# Patient Record
Sex: Female | Born: 1945 | Race: White | Hispanic: No | Marital: Married | Smoking: Never smoker
Health system: Southern US, Community
[De-identification: ages and names within clinical notes are randomized; demographics above are authoritative.]

## PROBLEM LIST (undated history)

## (undated) DIAGNOSIS — E7800 Pure hypercholesterolemia, unspecified: Secondary | ICD-10-CM

## (undated) DIAGNOSIS — R079 Chest pain, unspecified: Secondary | ICD-10-CM

## (undated) DIAGNOSIS — R0602 Shortness of breath: Secondary | ICD-10-CM

## (undated) DIAGNOSIS — R011 Cardiac murmur, unspecified: Secondary | ICD-10-CM

## (undated) DIAGNOSIS — R059 Cough, unspecified: Secondary | ICD-10-CM

## (undated) DIAGNOSIS — G4700 Insomnia, unspecified: Secondary | ICD-10-CM

## (undated) DIAGNOSIS — T7840XA Allergy, unspecified, initial encounter: Secondary | ICD-10-CM

## (undated) DIAGNOSIS — J309 Allergic rhinitis, unspecified: Secondary | ICD-10-CM

## (undated) DIAGNOSIS — R05 Cough: Secondary | ICD-10-CM

## (undated) HISTORY — DX: Shortness of breath: R06.02

## (undated) HISTORY — DX: Allergy, unspecified, initial encounter: T78.40XA

## (undated) HISTORY — DX: Cough, unspecified: R05.9

## (undated) HISTORY — DX: Pure hypercholesterolemia, unspecified: E78.00

## (undated) HISTORY — DX: Chest pain, unspecified: R07.9

## (undated) HISTORY — DX: Insomnia, unspecified: G47.00

## (undated) HISTORY — DX: Allergic rhinitis, unspecified: J30.9

## (undated) HISTORY — DX: Cough: R05

## (undated) HISTORY — DX: Cardiac murmur, unspecified: R01.1

---
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
## 1983-11-16 HISTORY — PX: BREAST SURGERY: SHX581

## 1988-11-15 HISTORY — PX: ABDOMINAL HYSTERECTOMY: SHX81

## 1988-11-15 HISTORY — PX: VESICOVAGINAL FISTULA CLOSURE W/ TAH: SUR271

## 2011-11-02 ENCOUNTER — Ambulatory Visit: Payer: Self-pay | Admitting: Family Medicine

## 2011-12-23 LAB — HM PAP SMEAR

## 2012-02-10 ENCOUNTER — Ambulatory Visit: Payer: Self-pay | Admitting: Family Medicine

## 2012-09-30 ENCOUNTER — Emergency Department: Payer: Self-pay | Admitting: Emergency Medicine

## 2012-09-30 LAB — COMPREHENSIVE METABOLIC PANEL
Albumin: 4.5 g/dL (ref 3.4–5.0)
Anion Gap: 7 (ref 7–16)
Calcium, Total: 9.6 mg/dL (ref 8.5–10.1)
Chloride: 107 mmol/L (ref 98–107)
Co2: 26 mmol/L (ref 21–32)
Creatinine: 0.76 mg/dL (ref 0.60–1.30)
EGFR (African American): >60
EGFR (Non-African Amer.): >60
Osmolality: 283 (ref 275–301)
SGOT(AST): 27 U/L (ref 15–37)
Total Protein: 8.4 g/dL — ABNORMAL HIGH (ref 6.4–8.2)

## 2012-09-30 LAB — CBC
HCT: 41.7 % (ref 35.0–47.0)
HGB: 14.1 g/dL (ref 12.0–16.0)
MCHC: 33.7 g/dL (ref 32.0–36.0)
RBC: 4.59 10*6/uL (ref 3.80–5.20)
RDW: 13.7 % (ref 11.5–14.5)

## 2012-09-30 LAB — CK TOTAL AND CKMB (NOT AT ARMC)
CK, Total: 85 U/L (ref 21–215)
CK-MB: 0.7 ng/mL (ref 0.5–3.6)

## 2012-09-30 LAB — TROPONIN I: Troponin-I: <0.02 ng/mL

## 2012-11-03 ENCOUNTER — Ambulatory Visit: Payer: Self-pay | Admitting: Family Medicine

## 2012-11-30 ENCOUNTER — Ambulatory Visit: Payer: Self-pay | Admitting: Family Medicine

## 2012-12-08 ENCOUNTER — Ambulatory Visit: Payer: Self-pay | Admitting: Internal Medicine

## 2013-01-15 ENCOUNTER — Encounter: Payer: Self-pay | Admitting: Pulmonary Disease

## 2013-01-16 ENCOUNTER — Encounter: Payer: Self-pay | Admitting: Pulmonary Disease

## 2013-01-16 ENCOUNTER — Ambulatory Visit (INDEPENDENT_AMBULATORY_CARE_PROVIDER_SITE_OTHER): Payer: BC Managed Care – PPO | Admitting: Pulmonary Disease

## 2013-01-16 ENCOUNTER — Other Ambulatory Visit: Payer: Self-pay | Admitting: Pulmonary Disease

## 2013-01-16 VITALS — BP 140/100 | HR 80 | Temp 98.3°F | Ht 60.0 in | Wt 139.0 lb

## 2013-01-16 DIAGNOSIS — R0609 Other forms of dyspnea: Secondary | ICD-10-CM

## 2013-01-16 DIAGNOSIS — R0789 Other chest pain: Secondary | ICD-10-CM

## 2013-01-16 DIAGNOSIS — R0602 Shortness of breath: Secondary | ICD-10-CM

## 2013-01-16 MED ORDER — LEVALBUTEROL TARTRATE 45 MCG/ACT IN AERO: 2.0000 | INHALATION_SPRAY | RESPIRATORY_TRACT | Status: DC | PRN

## 2013-01-16 NOTE — Assessment & Plan Note (Addendum)
I am encouraged by the fact that so far Diamond Allen's workup has been normal. This has included a negative stress test and apparently a normal echocardiogram. The flow volume loop from her recent spirometry is not consistent with COPD or other forms of airflow obstruction. Objectively, radiology felt that she had some hyperinflation on a recent chest x-ray though I have reviewed the images and do not feel that they are very abnormal.  I explained to her that I think the most likely etiology of her dyspnea, cough and fatigue is that she had a nasty viral illness which is taking quite some time to recover. Her cough has improved which is encouraging to me and her lung exam is normal. An alternative possibility could be some degree of vocal cord dysfunction given her hoarseness and cough which comes on with prolonged talking. Because the symptoms have persisted for so long I agree with the ongoing cardiac workup (including repeat echocardiogram today) it would be helpful to see full pulmonary function testing to evaluate for restrictive lung disease. We also need to see hemoglobin level.  Her primary care physician has raised the possibility of adult onset asthma. It is not uncommon after a viral illness prefer to have some degree of bronchospasm so I hope that this is the case rather than asthma. The best way to rule this out as with medical challenge testing.  We will start with full pulmonary function testing with bronchodilators to see if there is any evidence of reversible airflow obstruction.   Plan summary: -Full pulmonary function testing with and without bronchodilator -6 minute walk today -Obtain records of recent CBC -Obtain results of echocardiogram to be performed today -Obtain records from cardiologist -I encouraged her to start exercising regularly -Change albuterol to Xopenex to use as needed for shortness of breath and cough -f/u 6-8 weeks

## 2013-01-16 NOTE — Progress Notes (Signed)
Subjective:    Patient ID: Diamond Allen, female    DOB: [birth year], 67 y.o.   MRN: [id number]  HPI This is a very pleasant 67 year old female who comes to our clinic to establish care for shortness of breath. When she was a child she had pneumonia on several occasions up until age 71. This required multiple hospitalizations and stay in a "convalescent home" for several months at age 67. After that she's had a cough nonproductive of sputum for the vast majority of her life but no shortness of breath or other respiratory complaints. 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 Around Thanksgiving 2013 she developed a cough productive of scant sputum which persisted through January. She sought care in late December and was diagnosed with bronchitis. She was treated with an antibiotic (she does not remember the name), albuterol which made her heart race, and prednisone which made her face break out. She says that the cough from November through January was more frequent and intense than her baseline chronic cough. It was worse with talking, exposure to smoke and or dust. She also experienced a sensation that she could not take a deep breath and shortness of breath with moderate exertion. She stated that she is capable of walking through the grocery store without difficulty, however carrying in groceries is difficult. Vacuuming her house and climbing a flight of stairs causes shortness of breath but she does not have to stop. She also experiences chest tightness during this time. When she use the albuterol she never really noticed an improvement in her symptoms, just the tachycardia. She stated that she would feel tachycardic when exerting herself and feeling shortness of breath but not while at rest.  She has experienced some sinus congestion which has improved with the use of Flonase and Singulair. She denies heartburn  symptoms.  She has had an echocardiogram and stress test which were both read as normal. Her echocardiogram is to be repeated today do to a heart murmur.  She feels like her voice is raspy since having bronchitis. She denies any new rash, symmetric joint aches or swelling, unexplained fevers or weight changes. She does not have trouble swallowing. She does state that she has dry eyes and dry mouth and has dealt with this for many years.   Past Medical History  Diagnosis Date  . Allergic rhinitis   . Chest pain on exertion   . Cough   . Hypercholesterolemia   . Heart murmur   . Insomnia   . SOB (shortness of breath)      Family History  Problem Relation Age of Onset  . Scleroderma Sister   . Heart disease Maternal Grandmother   . Alzheimer's disease Maternal Grandmother   . Emphysema Maternal Grandfather   . Diabetes type II Maternal Grandfather   . Lymphoma Mother   . CVA Mother   . Diabetes type II Mother      History   Social History  . Marital Status: Married    Spouse Name: N/A    Number of Children: 4  . Years of Education: N/A   Occupational History  . Retired-Volunteers at [employer]  Other   Social History Main Topics  . Smoking status: Never Smoker   . Smokeless tobacco: Never Used  . Alcohol Use: 4.2 oz/week    7 Glasses of wine per week     Comment: 1  glass of wine with dinner every hs  . Drug Use: No  . Sexually Active: Not on file   Other Topics Concern  . Not on file   Social History Narrative  . No narrative on file     Allergies  Allergen Reactions  . Albuterol Palpitations  . Prednisone Rash     No outpatient prescriptions prior to visit.   No facility-administered medications prior to visit.      Review of Systems  Constitutional: Negative for fever, chills and unexpected weight change.  HENT: Positive for congestion. Negative for ear pain, nosebleeds, sore throat, rhinorrhea, sneezing, trouble swallowing, dental problem,  voice change, postnasal drip and sinus pressure.   Eyes: Negative for visual disturbance.  Respiratory: Positive for cough and shortness of breath. Negative for choking.   Cardiovascular: Positive for chest pain. Negative for leg swelling.  Gastrointestinal: Negative for vomiting, abdominal pain and diarrhea.  Genitourinary: Negative for difficulty urinating.  Musculoskeletal: Negative for arthralgias.  Skin: Negative for rash.  Neurological: Negative for tremors, syncope and headaches.  Hematological: Does not bruise/bleed easily.       Objective:   Physical Exam Filed Vitals:   01/16/13 1000  BP: 140/100  Pulse: 80  Temp: 98.3 F (36.8 C)  TempSrc: Oral  Height: 5' (1.524 m)  Weight: 139 lb (63.05 kg)  SpO2: 98%   Gen: well appearing, no acute distress HEENT: NCAT, PERRL, EOMi, OP clear, neck supple without masses PULM: CTA B CV: RRR, soft systolic murmur RUSB, no JVD AB: BS+, soft, nontender, no hsm Ext: warm, no edema, no clubbing, no cyanosis Derm: no rash or skin breakdown Neuro: A&Ox4, CN II-XII intact, strength 5/5 in all 4 extremities  11/2012 CXR ?hyperinflation and possible interstitial thickening per [facility] radiology 12/2012 Simple spiro normal 01/16/2013 1250 feet, O2 saturation 97% RA     Assessment & Plan:   Shortness of breath I am encouraged by the fact that so far Diamond Allen's workup has been normal. This has included a negative stress test and apparently a normal echocardiogram. The flow volume loop from her recent spirometry is not consistent with COPD or other forms of airflow obstruction. Objectively, radiology felt that she had some hyperinflation on a recent chest x-ray though I have reviewed the images and do not feel that they are very abnormal.  I explained to her that I think the most likely etiology of her dyspnea, cough and fatigue is that she had a nasty viral illness which is taking quite some time to recover. Her cough has improved which is  encouraging to me and her lung exam is normal. An alternative possibility could be some degree of vocal cord dysfunction given her hoarseness and cough which comes on with prolonged talking. Because the symptoms have persisted for so long I agree with the ongoing cardiac workup (including repeat echocardiogram today) it would be helpful to see full pulmonary function testing to evaluate for restrictive lung disease. We also need to see hemoglobin level.  Her primary care physician has raised the possibility of adult onset asthma. It is not uncommon after a viral illness prefer to have some degree of bronchospasm so I hope that this is the case rather than asthma. The best way to rule this out as with medical challenge testing.  We will start with full pulmonary function testing with bronchodilators to see if there is any evidence of reversible airflow obstruction.   Plan summary: -Full pulmonary function testing with and without  bronchodilator -6 minute walk today -Obtain records of recent CBC -Obtain results of echocardiogram to be performed today -Obtain records from cardiologist -I encouraged her to start exercising regularly -Change albuterol to Xopenex to use as needed for shortness of breath and cough -f/u 6-8 weeks  Chest tightness I am encouraged by the fact that her recent stress test showed no evidence of coronary ischemia. I agree with the plan for a repeat echocardiogram do to her heart murmur.  We'll obtain records from Dr. Philemon Kingdom office.    Updated Medication List Outpatient Encounter Prescriptions as of 01/16/2013  Medication Sig Dispense Refill  . b complex vitamins tablet Take 1 tablet by mouth daily.      . Eszopiclone (ESZOPICLONE) 3 MG TABS Take 3 mg by mouth at bedtime. Take immediately before bedtime      . fluticasone (FLONASE) 50 MCG/ACT nasal spray Place 2 sprays into the nose daily.      Marland Kitchen HYDROcodone-homatropine (HYCODAN) 5-1.5 MG/5ML syrup As directed as  needed for cough      . Magnesium 400 MG CAPS Take 1 tablet by mouth daily.      . montelukast (SINGULAIR) 10 MG tablet Take 10 mg by mouth daily as needed.      . Multiple Vitamins-Minerals (MULTIVITAMIN & MINERAL PO) Take 1 tablet by mouth daily.      . simvastatin (ZOCOR) 10 MG tablet Take 10 mg by mouth at bedtime.      . levalbuterol (XOPENEX HFA) 45 MCG/ACT inhaler Inhale 2 puffs into the lungs every 4 (four) hours as needed for wheezing.  1 Inhaler  2   No facility-administered encounter medications on file as of 01/16/2013.

## 2013-01-16 NOTE — Patient Instructions (Signed)
Use the Xopenex inhaler 2 puffs every four hours as needed for shortness of breath Start exercising regularly; you can use the inhaler ten minutes before exercise We will have you get full pulmonary function testing at [facility] We will see you back in 6-8 weeks or sooner if needed

## 2013-01-16 NOTE — Assessment & Plan Note (Signed)
I am encouraged by the fact that her recent stress test showed no evidence of coronary ischemia. I agree with the plan for a repeat echocardiogram do to her heart murmur.  We'll obtain records from Dr. Philemon Kingdom office.

## 2013-02-13 ENCOUNTER — Ambulatory Visit: Payer: Self-pay | Admitting: Family Medicine

## 2013-03-13 ENCOUNTER — Ambulatory Visit: Payer: BC Managed Care – PPO | Admitting: Pulmonary Disease

## 2013-06-20 ENCOUNTER — Other Ambulatory Visit: Payer: Self-pay

## 2013-08-20 ENCOUNTER — Ambulatory Visit: Payer: Self-pay | Admitting: Family Medicine

## 2013-08-22 ENCOUNTER — Other Ambulatory Visit: Payer: Self-pay | Admitting: Family Medicine

## 2013-08-23 ENCOUNTER — Ambulatory Visit: Payer: Self-pay | Admitting: Family Medicine

## 2013-08-23 ENCOUNTER — Inpatient Hospital Stay: Payer: Self-pay | Admitting: Internal Medicine

## 2013-08-23 LAB — CBC WITH DIFFERENTIAL/PLATELET
Basophil %: 0.7 %
Eosinophil #: 0.3 10*3/uL (ref 0.0–0.7)
HGB: 13.6 g/dL (ref 12.0–16.0)
Lymphocyte #: 1.4 10*3/uL (ref 1.0–3.6)
Lymphocyte %: 24.3 %
MCHC: 35.3 g/dL (ref 32.0–36.0)
Neutrophil #: 3.4 10*3/uL (ref 1.4–6.5)
Neutrophil %: 61.5 %
Platelet: 258 10*3/uL (ref 150–440)
RDW: 13.8 % (ref 11.5–14.5)
WBC: 5.6 10*3/uL (ref 3.6–11.0)

## 2013-08-23 LAB — COMPREHENSIVE METABOLIC PANEL
Alkaline Phosphatase: 93 U/L (ref 50–136)
Anion Gap: 7 (ref 7–16)
BUN: 20 mg/dL — ABNORMAL HIGH (ref 7–18)
Bilirubin,Total: 0.4 mg/dL (ref 0.2–1.0)
Calcium, Total: 9.1 mg/dL (ref 8.5–10.1)
EGFR (African American): >60
Glucose: 114 mg/dL — ABNORMAL HIGH (ref 65–99)
Osmolality: 281 (ref 275–301)
Potassium: 4 mmol/L (ref 3.5–5.1)
Sodium: 139 mmol/L (ref 136–145)
Total Protein: 7.5 g/dL (ref 6.4–8.2)

## 2013-08-24 LAB — BASIC METABOLIC PANEL
BUN: 15 mg/dL (ref 7–18)
Chloride: 107 mmol/L (ref 98–107)
Co2: 24 mmol/L (ref 21–32)
EGFR (African American): >60
EGFR (Non-African Amer.): >60
Glucose: 171 mg/dL — ABNORMAL HIGH (ref 65–99)
Osmolality: 281 (ref 275–301)

## 2013-08-24 LAB — CBC WITH DIFFERENTIAL/PLATELET
Eosinophil %: 0.2 %
HCT: 37.1 % (ref 35.0–47.0)
MCH: 31.4 pg (ref 26.0–34.0)
MCHC: 34.7 g/dL (ref 32.0–36.0)
MCV: 90 fL (ref 80–100)
Monocyte #: 0.1 x10 3/mm — ABNORMAL LOW (ref 0.2–0.9)
Monocyte %: 1.2 %
Neutrophil %: 88.2 %
Platelet: 245 10*3/uL (ref 150–440)
RDW: 13.9 % (ref 11.5–14.5)
WBC: 4.6 10*3/uL (ref 3.6–11.0)

## 2013-09-04 ENCOUNTER — Encounter: Payer: Self-pay | Admitting: Pulmonary Disease

## 2013-09-04 ENCOUNTER — Telehealth: Payer: Self-pay | Admitting: Pulmonary Disease

## 2013-09-04 ENCOUNTER — Ambulatory Visit: Payer: Self-pay | Admitting: Pulmonary Disease

## 2013-09-04 ENCOUNTER — Ambulatory Visit (INDEPENDENT_AMBULATORY_CARE_PROVIDER_SITE_OTHER): Payer: Medicare Other | Admitting: Pulmonary Disease

## 2013-09-04 VITALS — BP 138/98 | HR 99 | Temp 99.0°F | Ht 60.0 in | Wt 141.0 lb

## 2013-09-04 DIAGNOSIS — R0602 Shortness of breath: Secondary | ICD-10-CM

## 2013-09-04 DIAGNOSIS — R059 Cough, unspecified: Secondary | ICD-10-CM

## 2013-09-04 DIAGNOSIS — R05 Cough: Secondary | ICD-10-CM | POA: Insufficient documentation

## 2013-09-04 DIAGNOSIS — J189 Pneumonia, unspecified organism: Secondary | ICD-10-CM

## 2013-09-04 MED ORDER — E-Z SPACER DEVI: Status: DC

## 2013-09-04 MED ORDER — TRAMADOL HCL 50 MG PO TABS: 50.0000 mg | ORAL_TABLET | Freq: Four times a day (QID) | ORAL | Status: AC | PRN

## 2013-09-04 MED ORDER — E-Z SPACER DEVI: Status: AC

## 2013-09-04 MED ORDER — PREDNISONE 10 MG PO TABS: ORAL_TABLET | ORAL | Status: DC

## 2013-09-04 MED ORDER — LEVOFLOXACIN 750 MG PO TABS: 750.0000 mg | ORAL_TABLET | Freq: Every day | ORAL | Status: AC

## 2013-09-04 NOTE — Telephone Encounter (Signed)
I spoke with pt. She went and CXR done. I do not see any report in EPIC and pt reports she did not have this done at [facility] or the [facility] primary care office in [place]. She could not remember what the name of the place is called she had this done at. She will call and find out and call us back so we can get this report sent to [place]. Will await a call back

## 2013-09-04 NOTE — Progress Notes (Signed)
Subjective:    Patient ID: Diamond Allen, female    DOB: 1946/05/22, 67 y.o.   MRN: [id number]  Synopsis: Diamond Allen first saw the [facility] pulmonary clinic in March of 2014 for evaluation of cough and shortness of breath. Her symptoms resolve spontaneously and she was lost to followup. She had simple spirometry in the past which was normal. Chest imaging had shown hyperinflation. A CT chest during a hospitalization for pneumonia in September 2014 showed no evidence of pulmonary fibrosis, mild emphysema, and pneumonia in the right lung.  HPI  09/04/2013 ROV> Rai was hospitalized recently for pneumonia and had to be hospitalized for 24 hours.  She developed the cough wo [place] ago while [place] the beach.  After three days she went to Dr. Santiago Bur office and then ended up in the hospital with levaquin.  She continues to have fatigue and cough productive of pale green sputum.  Some chills. She feels that the cough spasms are worse since discontinuing the prednisone and antibiotic.   Past Medical History  Diagnosis Date  . Allergic rhinitis   . Chest pain on exertion   . Cough   . Hypercholesterolemia   . Heart murmur   . Insomnia   . SOB (shortness of breath)      Review of Systems  Constitutional: Positive for chills and fatigue. Negative for fever.  HENT: Positive for mouth sores. Negative for congestion, postnasal drip and rhinorrhea.   Respiratory: Positive for cough, shortness of breath and wheezing.   Cardiovascular: Negative for chest pain, palpitations and leg swelling.       Objective:   Physical Exam  Filed Vitals:   09/04/13 1040  BP: 138/98  Pulse: 99  Temp: 99 F (37.2 C)  TempSrc: Oral  Height: 5' (1.524 m)  Weight: 141 lb (63.957 kg)  SpO2: 96%    Gen: cough, mildly ill appearing, no acute distress HEENT: NCAT, PERRL PULM: crackles R lung throughout, some left base CV: RRR, no mgr AB: BS+,s oft, nontender Ext: warm, no edema  9 2014 CT  chest>> no evidence of fibrosis, perhaps very mild centrilobular emphysema, right lower lobe infiltrate consistent with pneumonia     Assessment & Plan:   Pneumonia I am concerned that Mrs. Cutshaw's pneumonia has yet to resolve. She continues to have crackles throughout her right lung and now some in the left base. At this point, she does not need to be hospitalized but if she's not improved in the next 24-48 hours she may need to be hospitalized again.  Plan:  -Levaquin 750 for a week -Prednisone for wheezing, concern for asthma -Chest x-ray today > I instructed her to call me once this is done so I can see the images before he leaves the office -Instructed at length to go to the hospital if symptoms are worse  Shortness of breath I agree with the Symbicort. I am going to add a spacer. She needs a methacholine challenge to evaluate for asthma which I think is very likely.  Plan: -Spacer -Prednisone taper  Cough Treat pneumonia Check chest x-ray Tramadol during the day as needed Tussionex at night, do not take with tramadol    Updated Medication List Outpatient Encounter Prescriptions as of 09/04/2013  Medication Sig Dispense Refill  . b complex vitamins tablet Take 1 tablet by mouth daily.      . budesonide-formoterol (SYMBICORT) 160-4.5 MCG/ACT inhaler Inhale 2 puffs into the lungs 2 (two) times daily.      Marland Kitchen  chlorpheniramine-HYDROcodone (TUSSIONEX) 10-8 MG/5ML LQCR Take 5 mLs by mouth every 12 (twelve) hours as needed.      . Eszopiclone (ESZOPICLONE) 3 MG TABS Take 3 mg by mouth at bedtime. Take immediately before bedtime      . Magnesium 400 MG CAPS Take 1 tablet by mouth daily.      . montelukast (SINGULAIR) 10 MG tablet Take 10 mg by mouth daily as needed.      . Multiple Vitamins-Minerals (MULTIVITAMIN & MINERAL PO) Take 1 tablet by mouth daily.      . simvastatin (ZOCOR) 10 MG tablet Take 10 mg by mouth at bedtime.      . fluticasone (FLONASE) 50 MCG/ACT nasal spray  Place 2 sprays into the nose daily.      . [DISCONTINUED] HYDROcodone-homatropine (HYCODAN) 5-1.5 MG/5ML syrup As directed as needed for cough      . [DISCONTINUED] levalbuterol (XOPENEX HFA) 45 MCG/ACT inhaler Inhale 2 puffs into the lungs every 4 (four) hours as needed for wheezing.  1 Inhaler  2   No facility-administered encounter medications on file as of 09/04/2013.

## 2013-09-04 NOTE — Addendum Note (Signed)
Addended by: Caryl Ada on: 09/04/2013 03:09 PM   Modules accepted: Orders

## 2013-09-04 NOTE — Assessment & Plan Note (Addendum)
I am concerned that Mrs. Dyar's pneumonia has yet to resolve. She continues to have crackles throughout her right lung and now some in the left base. At this point, she does not need to be hospitalized but if she's not improved in the next 24-48 hours she may need to be hospitalized again.  Plan:  -Levaquin 750 for a week -Prednisone for wheezing, concern for asthma -Chest x-ray today > I instructed her to call me once this is done so I can see the images before he leaves the office -Instructed at length to go to the hospital if symptoms are worse

## 2013-09-04 NOTE — Assessment & Plan Note (Signed)
Treat pneumonia Check chest x-ray Tramadol during the day as needed Tussionex at night, do not take with tramadol

## 2013-09-04 NOTE — Assessment & Plan Note (Signed)
I agree with the Symbicort. I am going to add a spacer. She needs a methacholine challenge to evaluate for asthma which I think is very likely.  Plan: -Spacer -Prednisone taper

## 2013-09-04 NOTE — Patient Instructions (Signed)
Go get a Chest X-ray now and call us after it is complete Take the prednisone taper Take the levaquin If you feel worse, go to the hospital  Use the symbicort with a spacer  Use the tramadol every six hours as needed for the cough during the day, do not take it with the Tussionex  We will see you back in 2 weeks or sooner if needed

## 2013-09-05 NOTE — Telephone Encounter (Signed)
BQ has already looked at the pt's CXR. Pt is aware of results. This was taken care of yesterday (09/04/13)

## 2013-09-10 ENCOUNTER — Telehealth: Payer: Self-pay | Admitting: *Deleted

## 2013-09-10 NOTE — Telephone Encounter (Signed)
Pt advised. Zygmund Passero, CMA  

## 2013-09-10 NOTE — Telephone Encounter (Signed)
LMOM x 1 

## 2013-09-10 NOTE — Telephone Encounter (Signed)
Pt returned call and can be reached @ [phone]. Diamond Allen

## 2013-09-10 NOTE — Telephone Encounter (Signed)
Message copied by Maisie Fus on Mon Sep 10, 2013 11:09 AM ------      Message from: Lupita Leash      Created: Mon Sep 10, 2013  9:34 AM       A,      Please call her to let her know that her pneumonia looked like it was slowly clearing up.      Thanks,      B ------

## 2013-09-16 ENCOUNTER — Emergency Department: Payer: Self-pay | Admitting: Emergency Medicine

## 2013-09-16 LAB — COMPREHENSIVE METABOLIC PANEL
Albumin: 3.5 g/dL (ref 3.4–5.0)
Alkaline Phosphatase: 83 U/L (ref 50–136)
Anion Gap: 9 (ref 7–16)
BUN: 30 mg/dL — ABNORMAL HIGH (ref 7–18)
Chloride: 107 mmol/L (ref 98–107)
EGFR (African American): >60
EGFR (Non-African Amer.): >60
Glucose: 158 mg/dL — ABNORMAL HIGH (ref 65–99)
Osmolality: 283 (ref 275–301)
Potassium: 5.4 mmol/L — ABNORMAL HIGH (ref 3.5–5.1)
SGOT(AST): 46 U/L — ABNORMAL HIGH (ref 15–37)
SGPT (ALT): 32 U/L (ref 12–78)
Total Protein: 7.7 g/dL (ref 6.4–8.2)

## 2013-09-16 LAB — MAGNESIUM: Magnesium: 2.1 mg/dL

## 2013-09-16 LAB — CBC
MCH: 32.4 pg (ref 26.0–34.0)
MCHC: 35.2 g/dL (ref 32.0–36.0)
RBC: 4.76 10*6/uL (ref 3.80–5.20)
RDW: 14.2 % (ref 11.5–14.5)
WBC: 7.3 10*3/uL (ref 3.6–11.0)

## 2013-09-16 LAB — TROPONIN I: Troponin-I: <0.02 ng/mL

## 2013-09-18 ENCOUNTER — Encounter: Payer: Self-pay | Admitting: Pulmonary Disease

## 2013-09-18 ENCOUNTER — Ambulatory Visit (INDEPENDENT_AMBULATORY_CARE_PROVIDER_SITE_OTHER): Payer: Medicare Other | Admitting: Pulmonary Disease

## 2013-09-18 VITALS — BP 112/78 | HR 106 | Ht 60.0 in | Wt 139.8 lb

## 2013-09-18 DIAGNOSIS — R0602 Shortness of breath: Secondary | ICD-10-CM

## 2013-09-18 DIAGNOSIS — J45909 Unspecified asthma, uncomplicated: Secondary | ICD-10-CM

## 2013-09-18 DIAGNOSIS — R002 Palpitations: Secondary | ICD-10-CM | POA: Insufficient documentation

## 2013-09-18 DIAGNOSIS — J189 Pneumonia, unspecified organism: Secondary | ICD-10-CM

## 2013-09-18 MED ORDER — BUDESONIDE-FORMOTEROL FUMARATE 80-4.5 MCG/ACT IN AERO: 2.0000 | INHALATION_SPRAY | Freq: Two times a day (BID) | RESPIRATORY_TRACT | Status: DC

## 2013-09-18 NOTE — Assessment & Plan Note (Signed)
Resolved

## 2013-09-18 NOTE — Patient Instructions (Signed)
We will schedule an asthma test for you in January and will see you after the test. If you are still feeling bad, then you can re-schedule the test  You should gradually improve over the next few weeks.  If you get worse, see your PCP or your cardiologist  Take the symbicort regularly, I will send in an Rx  We will see you back in January 2015

## 2013-09-18 NOTE — Assessment & Plan Note (Signed)
This continues to be a problem for her and hopefully will stop now that she's off of the prednisone. This is been worked up in the past with a Holter monitor test which was normal. Her lung exam is normal. Her recent echocardiogram was normal. And a myocardial stress test within the last year was normal. Based on all of this I am reassured that she does not have a cardiac abnormality and I explained that to her today. However, if the palpitations get worse she may need to see Dr. Mariah Milling again.

## 2013-09-18 NOTE — Progress Notes (Signed)
Subjective:    Patient ID: Diamond Allen, female    DOB: 1946-03-09, 67 y.o.   MRN: [id number]  Synopsis: Diamond Allen first saw the [facility] pulmonary clinic in March of 2014 for evaluation of cough and shortness of breath. Her symptoms resolve spontaneously and she was lost to followup. She had simple spirometry in the past which was normal. Chest imaging had shown hyperinflation. A CT chest during a hospitalization for pneumonia in September 2014 showed no evidence of pulmonary fibrosis, mild emphysema, and pneumonia in the right lung.  HPI   09/04/2013 ROV> Diamond Allen was hospitalized recently for pneumonia and had to be hospitalized for 24 hours.  She developed the cough wo weeks ago while at the beach.  After three days she went to Dr. Santiago Bur office and then ended up in the hospital with levaquin.  She continues to have fatigue and cough productive of pale green sputum.  Some chills. She feels that the cough spasms are worse since discontinuing the prednisone and antibiotic.  09/18/2013 ROV> Diamond Allen his cough has improved since the last visit. However, she continues to have some hoarseness, intermittent sweats, a lot of fatigue, and chest palpitations. She has had a minimal amount of white sputum production lately but it is rare. She actually went to the emergency room over the weekend because of the palpitations and underwent another CT chest which again showed that there is no pulmonary embolism. Her shortness of breath has slowly improved. She is just frustrated by how slow it is taking her to improve. She continues to take the Symbicort. She is off of the prednisone.   Past Medical History  Diagnosis Date  . Allergic rhinitis   . Chest pain on exertion   . Cough   . Hypercholesterolemia   . Heart murmur   . Insomnia   . SOB (shortness of breath)      Review of Systems  Constitutional: Positive for fatigue. Negative for fever and chills.  HENT: Negative for congestion,  mouth sores, postnasal drip and rhinorrhea.   Respiratory: Positive for shortness of breath. Negative for cough and wheezing.   Cardiovascular: Positive for palpitations. Negative for chest pain and leg swelling.       Objective:   Physical Exam   Filed Vitals:   09/18/13 0921  BP: 112/78  Pulse: 106  Height: 5' (1.524 m)  Weight: 139 lb 12.8 oz (63.413 kg)  SpO2: 97%    Gen:  no acute distress HEENT: NCAT, PERRL PULM: Clear to auscultation bilaterally CV: RRR, no mgr AB: BS+,s oft, nontender Ext: warm, no edema  11/2012 CXR ?hyperinflation and possible interstitial thickening per [facility] 12/2012 Simple Spirometry [facility] normal. 01/16/2013 >> 1250 feet O2 saturation 97% on room air September 2014 CT chest> no pulmonary embolism, right upper lobe and right middle lobe airspace disease consistent with infection, very mild centrilobular emphysema in the upper lobes October 2014 CT chest> no pulmonary embolism, the infiltrate previously seen has cleared, emphysema again seen     Assessment & Plan:   Shortness of breath This symptom as well as the cough has slowly started to improve. She has no pulmonary embolism and her pneumonia has cleared.  I am still not entirely clear as to whether or not she has asthma, it could be possible.  Plan: -Continue combination steroid and bronchodilator for now -Medical in challenge in January 2014 -Followup with me after that.    Pneumonia Resolved  Palpitations This continues  to be a problem for her and hopefully will stop now that she's off of the prednisone. This is been worked up in the past with a Holter monitor test which was normal. Her lung exam is normal. Her recent echocardiogram was normal. And a myocardial stress test within the last year was normal. Based on all of this I am reassured that she does not have a cardiac abnormality and I explained that to her today. However, if the palpitations get  worse she may need to see Dr. Mariah Milling again.    Updated Medication List Outpatient Encounter Prescriptions as of 09/18/2013  Medication Sig  . b complex vitamins tablet Take 1 tablet by mouth daily.  . chlorpheniramine-HYDROcodone (TUSSIONEX) 10-8 MG/5ML LQCR Take 5 mLs by mouth every 12 (twelve) hours as needed.  . Eszopiclone (ESZOPICLONE) 3 MG TABS Take 3 mg by mouth at bedtime. Take immediately before bedtime  . fluticasone (FLONASE) 50 MCG/ACT nasal spray Place 2 sprays into the nose daily.  . Magnesium 400 MG CAPS Take 1 tablet by mouth daily.  . montelukast (SINGULAIR) 10 MG tablet Take 10 mg by mouth daily as needed.  . Multiple Vitamins-Minerals (MULTIVITAMIN & MINERAL PO) Take 1 tablet by mouth daily.  . simvastatin (ZOCOR) 10 MG tablet Take 10 mg by mouth at bedtime.  Marland Kitchen Spacer/Aero-Holding Chambers (E-Z SPACER) inhaler Use as instructed  . traMADol (ULTRAM) 50 MG tablet Take 1 tablet (50 mg total) by mouth every 6 (six) hours as needed (cough).  . [DISCONTINUED] budesonide-formoterol (SYMBICORT) 160-4.5 MCG/ACT inhaler Inhale 2 puffs into the lungs 2 (two) times daily.  . budesonide-formoterol (SYMBICORT) 80-4.5 MCG/ACT inhaler Inhale 2 puffs into the lungs 2 (two) times daily.  . [DISCONTINUED] predniSONE (DELTASONE) 10 MG tablet Take 40mg  po daily for 3 days, then take 30mg  po daily for 3 days, then take 20mg  po daily for two days, then take 10mg  po daily for 2 days

## 2013-09-18 NOTE — Assessment & Plan Note (Signed)
This symptom as well as the cough has slowly started to improve. She has no pulmonary embolism and her pneumonia has cleared.  I am still not entirely clear as to whether or not she has asthma, it could be possible.  Plan: -Continue combination steroid and bronchodilator for now -Medical in challenge in January 2014 -Followup with me after that.

## 2013-10-03 ENCOUNTER — Encounter: Payer: Self-pay | Admitting: Pulmonary Disease

## 2013-12-11 ENCOUNTER — Ambulatory Visit: Payer: Self-pay | Admitting: Pulmonary Disease

## 2013-12-11 LAB — PULMONARY FUNCTION TEST

## 2013-12-17 ENCOUNTER — Encounter: Payer: Self-pay | Admitting: Pulmonary Disease

## 2013-12-17 ENCOUNTER — Telehealth: Payer: Self-pay

## 2013-12-17 NOTE — Telephone Encounter (Signed)
Message copied by Velvet BatheAULFIELD, ASHLEY L on Mon Dec 17, 2013 10:01 AM ------      Message from: Max FickleMCQUAID, DOUGLAS B      Created: Mon Dec 17, 2013  9:09 AM       A,            Please let her know that her methacholine challenge test was normal            She does not have asthma            Thanks      B ------

## 2013-12-17 NOTE — Telephone Encounter (Signed)
Pt aware of results.  Nothing further needed.  

## 2014-01-28 ENCOUNTER — Encounter: Payer: Self-pay | Admitting: Pulmonary Disease

## 2014-02-15 ENCOUNTER — Ambulatory Visit: Payer: Self-pay | Admitting: Family Medicine

## 2014-04-16 ENCOUNTER — Ambulatory Visit: Payer: Self-pay | Admitting: Gastroenterology

## 2014-04-16 LAB — HM COLONOSCOPY

## 2014-04-19 LAB — PATHOLOGY REPORT

## 2014-06-19 LAB — BASIC METABOLIC PANEL
BUN: 25 mg/dL — AB (ref 4–21)
Creatinine: 0.7 mg/dL (ref 0.5–1.1)
GLUCOSE: 103 mg/dL
POTASSIUM: 4.3 mmol/L (ref 3.4–5.3)
Sodium: 140 mmol/L (ref 137–147)

## 2014-06-19 LAB — CBC AND DIFFERENTIAL
HCT: 42 % (ref 36–46)
Hemoglobin: 14.1 g/dL (ref 12.0–16.0)
Neutrophils Absolute: 3 /uL
Platelets: 272 10*3/uL (ref 150–399)
WBC: 5 10*3/mL

## 2014-06-19 LAB — HEPATIC FUNCTION PANEL
ALT: 23 U/L (ref 7–35)
AST: 18 U/L (ref 13–35)
Alkaline Phosphatase: 71 U/L (ref 25–125)
BILIRUBIN, TOTAL: 0.6 mg/dL

## 2014-06-19 LAB — TSH: TSH: 1.29 u[IU]/mL (ref 0.41–5.90)

## 2014-06-20 ENCOUNTER — Ambulatory Visit: Payer: Self-pay | Admitting: Family Medicine

## 2014-07-08 ENCOUNTER — Ambulatory Visit: Payer: Self-pay | Admitting: Otolaryngology

## 2014-08-06 ENCOUNTER — Ambulatory Visit: Payer: Self-pay | Admitting: Family Medicine

## 2014-08-19 IMAGING — CT CT CHEST W/ CM
1 series · 15 of 32 positions shown, 19 images · IV contrast (APPLIED)
Comparison: none

Embolism Protocol
COMMENTS:

PROCEDURE:     CT  - CT CHEST (FOR PE) W  - August 23, 2013  [DATE]
RESULT:     Chest CT dated 08/23/2013
TECHNIQUE: Helical 3 mm sections were obtained from the thoracic inlet
through the lung bases status post intravenous administration of 100 mL of
[id number].

[Series 4: soft tissue · axial · 0.65mm/px · z∈[-622,-346]mm · 15 of 103 slices shown, 19 images]
[im 7/103  soft-tissue]
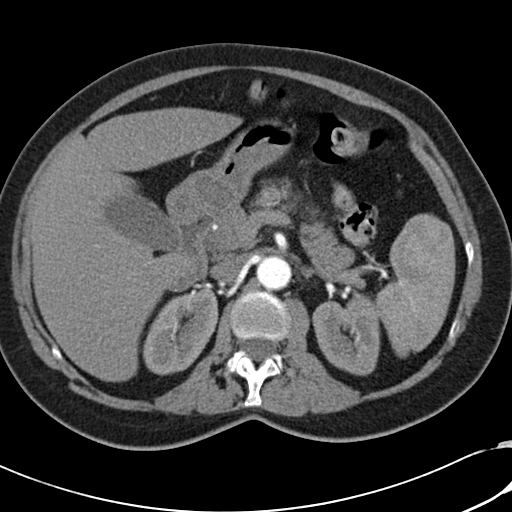
[im 7/103  bone]
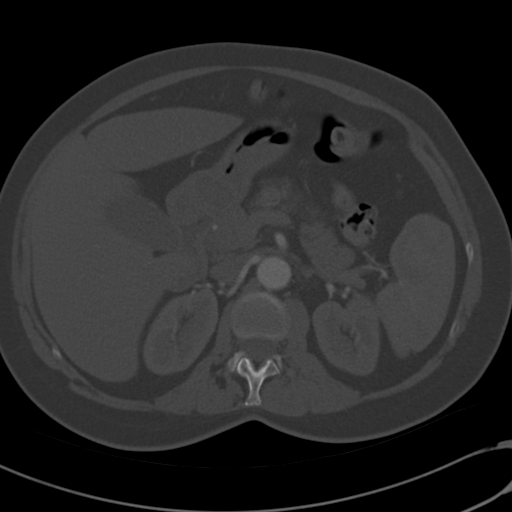
[im 14/103  soft-tissue]
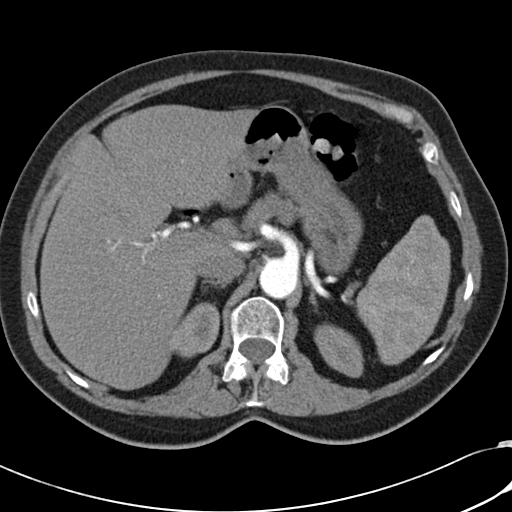
[im 20/103  soft-tissue]
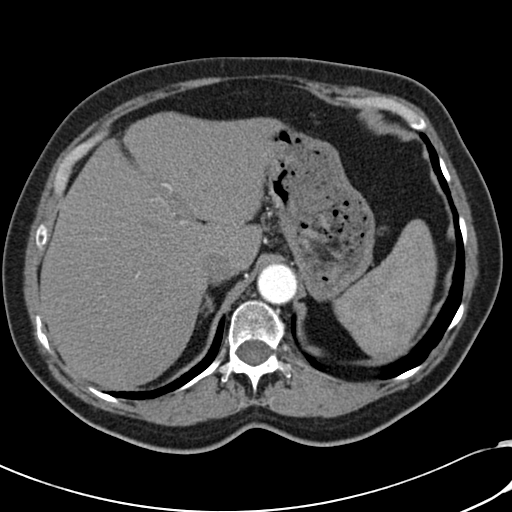
[im 30/103  soft-tissue]
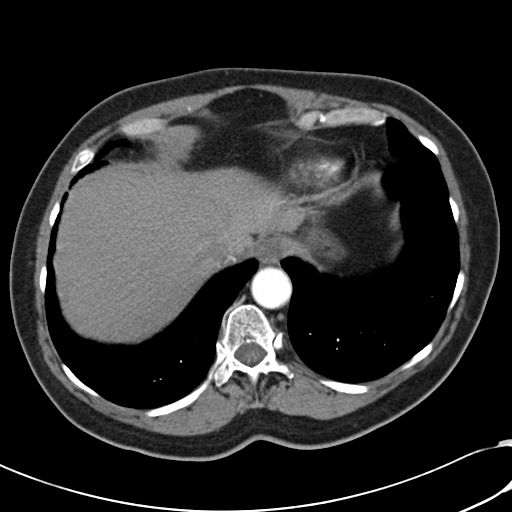
[im 37/103  soft-tissue]
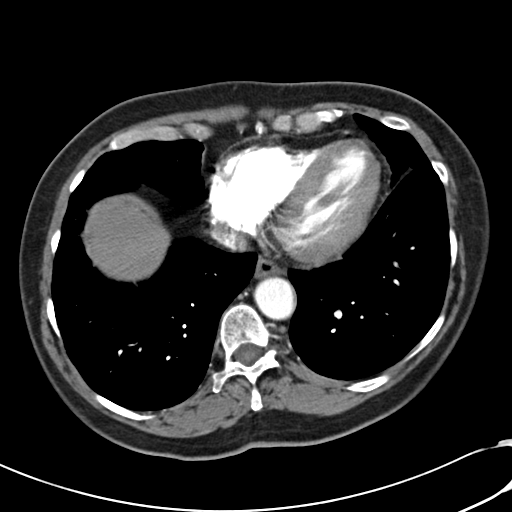
[im 43/103  soft-tissue]
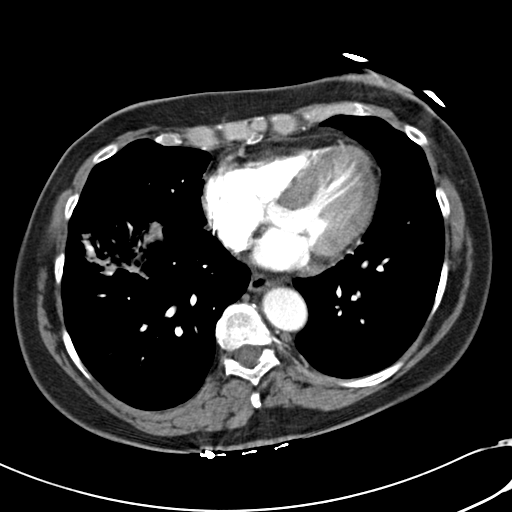
[im 53/103  soft-tissue]
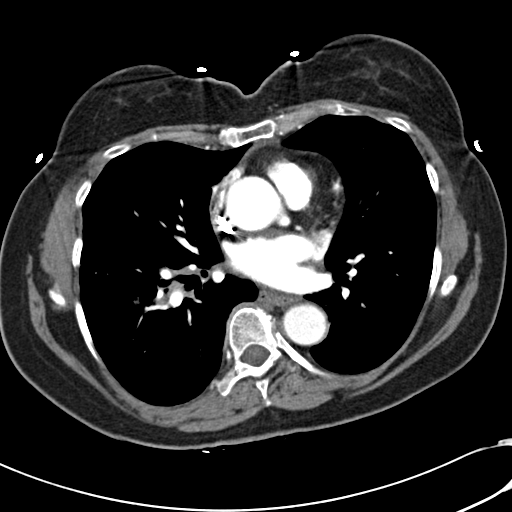
[im 60/103  soft-tissue]
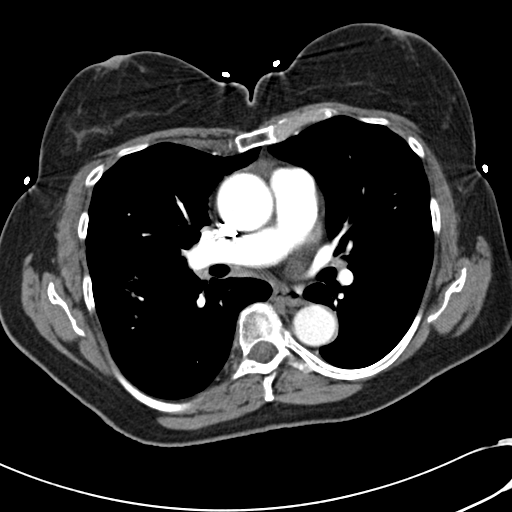
[im 66/103  soft-tissue]
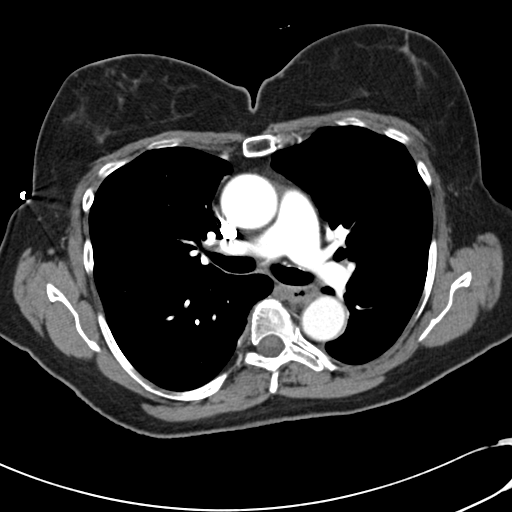
[im 66/103  bone]
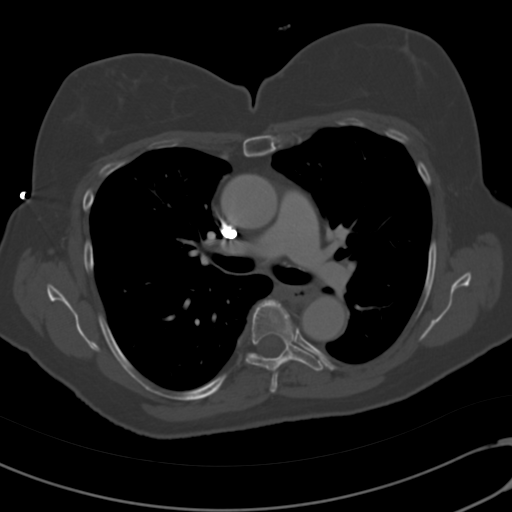
[im 73/103  soft-tissue]
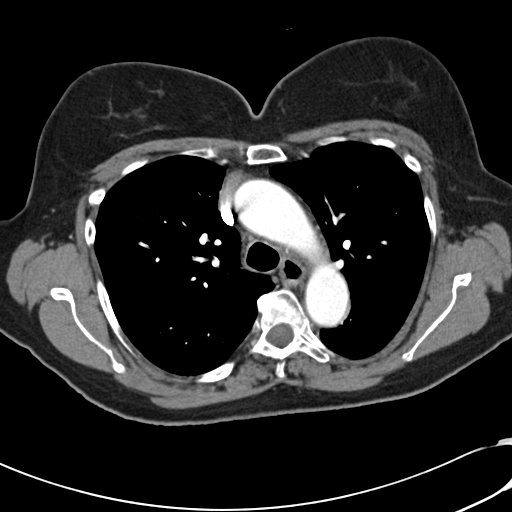
[im 83/103  soft-tissue]
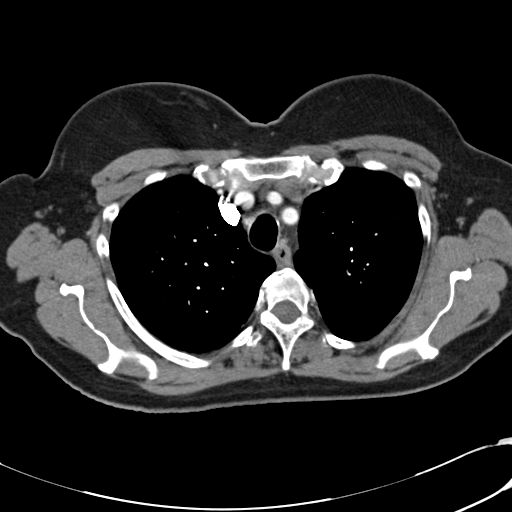
[im 89/103  soft-tissue]
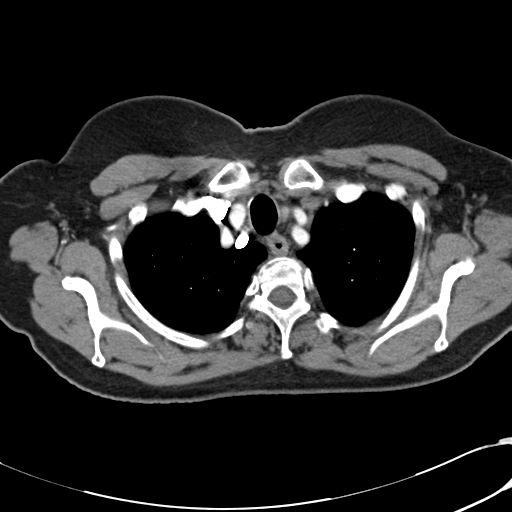
[im 89/103  lung]
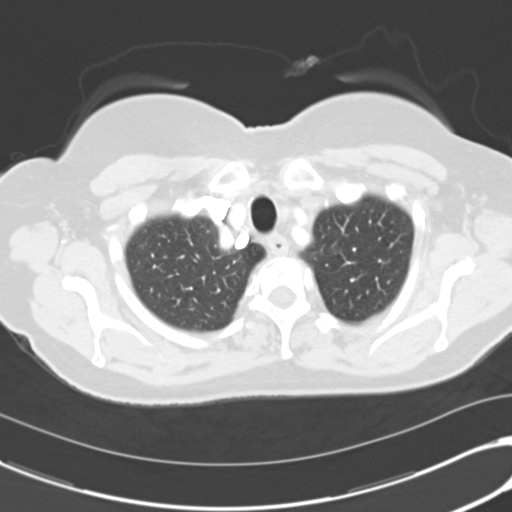
[im 93/103  lung]
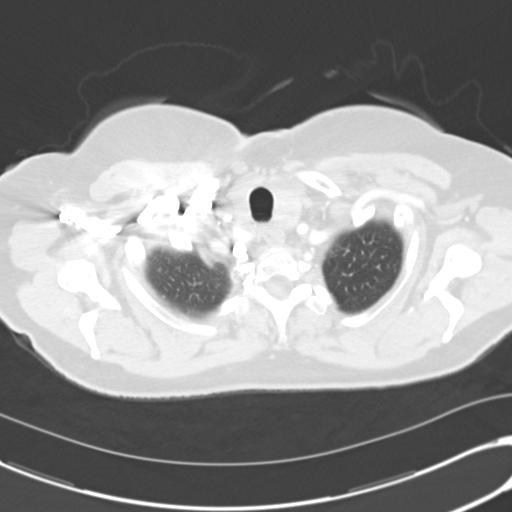
[im 96/103  soft-tissue]
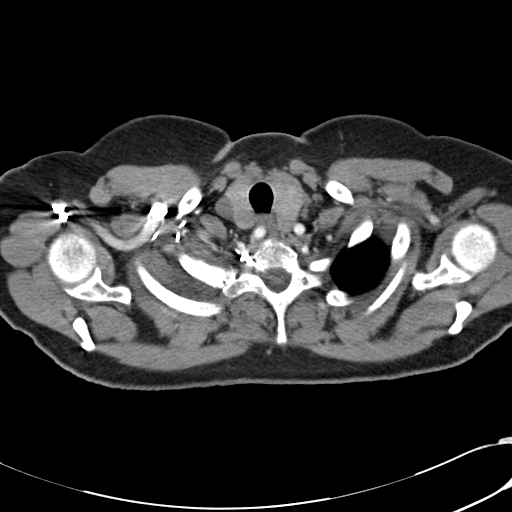
[im 96/103  lung]
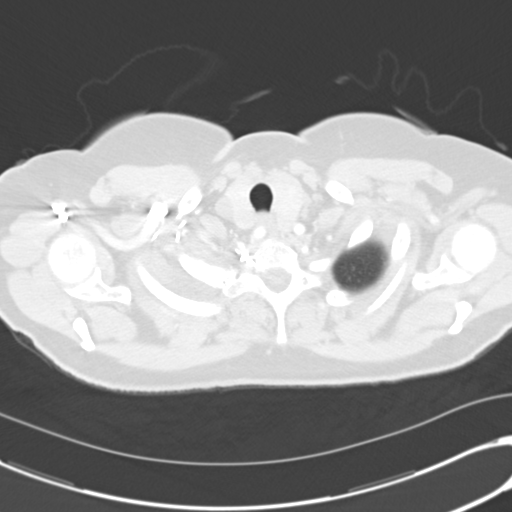
[im 99/103  lung]
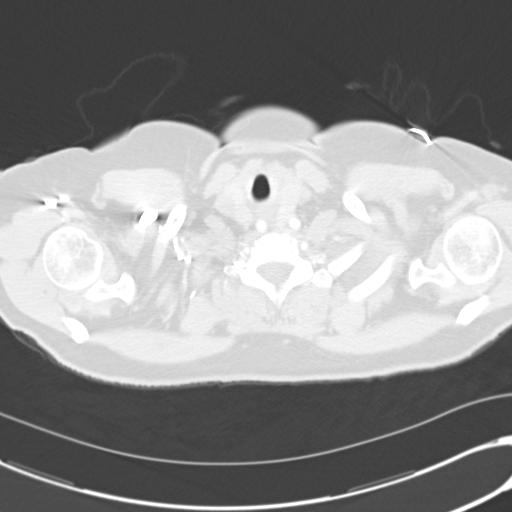

[15 of 32 positions shown; findings below may reference images not displayed]

FINDINGS: The rest inlet demonstrates enlargement of the left lobe of the
thyroid clinically warranted further evaluation with thyroid ultrasound
recommended.

The mediastinum, hilar regions, and structures or unremarkable. There is no
evidence of a filling defect within the main, lobar, or segmental pulmonary
arteries.

An ill-defined area of increased density projects within the lateral basal
segment of the right lower lobe. No further focal regions of consolidation
or focal infiltrates are appreciated. There is otherwise no CT evidence of
masses or nodules.

Visualized upper abdominal viscera demonstrate a low attenuating focus
within the no of the liver medially. Slight represents a cyst. An
ill-defined small area of increased density projects in the central aspect
of the right lobe of the liver image 94 soft tissue series an indeterminate
finding surveillance evaluation recommended if and as clinically warranted.
Remaining visualized upper abdominal viscera are unremarkable.
IMPRESSION: 1. No CT evidence of pulmonary arterial embolic disease.
2. Infiltrate within the right lower lobe in the appropriate clinical
setting pneumonitis, infectious is a diagnostic consideration. In the
absence of signs and symptoms of infection inflammatory infiltrate cannot be
excluded though more ominous etiologies such as neoplastic disease are of
diagnostic consideration. Surveillance evaluation recommended status post
appropriate therapeutic regiment. If clinically warranted further evaluation
with pulmonology consultation.

## 2014-12-19 ENCOUNTER — Ambulatory Visit: Payer: Self-pay | Admitting: Family Medicine

## 2015-02-18 ENCOUNTER — Ambulatory Visit
Admit: 2015-02-18 | Disposition: A | Discharge status: Home / Self Care | Payer: Self-pay | Attending: Family Medicine | Admitting: Family Medicine

## 2015-02-18 LAB — HM MAMMOGRAPHY

## 2015-02-26 DIAGNOSIS — I10 Essential (primary) hypertension: Secondary | ICD-10-CM | POA: Insufficient documentation

## 2015-03-07 NOTE — Discharge Summary (Signed)
PATIENT NAME:  Diamond Allen, Sun M MR#:  [id number] DATE OF BIRTH:  21-Feb-1946  DATE OF ADMISSION:  08/23/2013 DATE OF DISCHARGE:  08/24/2013  PRESENTING COMPLAINT:  Shortness of breath, cough and fever.   DISCHARGE DIAGNOSES:  1. Systemic inflammatory response syndrome secondary to right middle lobe pneumonia, improved.  2.  A history of bronchitis.  3.  Hyperlipidemia.  4.  A history of pneumonia.   CONDITION ON DISCHARGE:  Fair. Vitals stable, sats 96% on room air. Her pulse is 98.   LABORATORY DATA:  On discharge:  White count is 4.6. Basic metabolic panel within normal limits, except glucose of 114. LFTs within normal limits. CT of the chest negative for PE. There infiltrate in the right middle lobe. TSH is 1.39.   MEDICATIONS AT DISCHARGE: 1.  Levaquin 750 p.o. daily for 4 more days.  2.  Simvastatin 10 mg daily.  3.  Magnesium SR 500 mg p.o. daily.  4.  Eszopiclone 3 mg at bedtime.  5.  Hydrocodone 5 mL p.o. b.i.d.  6.  Prednisone taper as indicated.   DIET:  Low sodium.   FOLLOWUP:  With Dr. Elease HashimotoMaloney in 1 to 2 weeks.   Diamond Allen is a 69 year old, pleasant Caucasian female with a history of bronchitis, who comes into the Emergency Room after she started to have increasing cough and fever. She was admitted with:  1.  SIRS secondary to right-sided pneumonia. She was started on Levaquin and Zosyn and change to p.o. Levaquin 750 b.i.d. p.o. daily for 4 more days. She had no fever. White count was normal, sats are 96%. She is tolerating diet and requested to go home.  2.  Mild tachycardia due to infection. Heart rate between 80 to 111.  3.  Insomnia. She is Lunesta, which we will continue.  4.  Overall hospital stay remained stable. The patient will follow up with Dr. Elease HashimotoMaloney as outpatient.   TIME SPENT: 40 minutes.   ____________________________ Wylie HailSona A. Allena KatzPatel, MD sap:jm D: 08/24/2013 15:33:00 ET T: 08/24/2013 17:45:50 ET JOB#: [id number]  cc: Andria Head A. Allena KatzPatel, MD, <Dictator> Leo GrosserNancy  J. Maloney, MD Willow OraSONA A Muriel Wilber MD ELECTRONICALLY SIGNED 09/07/2013 15:04

## 2015-03-07 NOTE — H&P (Signed)
PATIENT NAME:  Diamond SableMURRAY, Diamond Allen MR#:  [id number] DATE OF BIRTH:  [birth year]  PRIMARY CARE PHYSICIAN: Lorie PhenixNancy Maloney, MD.   REQUESTING PHYSICIAN: Lorie PhenixNancy Maloney, MD.   CHIEF COMPLAINT: Cough.  HISTORY OF PRESENT ILLNESS: The patient is a 69 year old female with a known history of insomnia and bronchitis and hyperlipidemia who is requested a direct admit by Dr. Lorie PhenixNancy Maloney. The patient has been suffering from a bad cough since last Wednesday when she was on a vacation to a beach. Started having greenish cough for the first few days, went to see Dr. Elease HashimotoMaloney on Monday and was prescribed Levaquin 500 mg. Did not get significantly better; actually, she was very tachycardic and short of breath and was also running low-grade fever and called Dr. Santiago BurMaloney's office for a follow-up. Dr. Elease HashimotoMaloney discussed the case with me and she is getting direct admit for further evaluation and management.   She did undergo chest x-ray on 6th of October which showed right middle lobe pneumonia. She also underwent CT of the chest considering ongoing tachycardia and was negative for PE, but she did have right lower lobe infiltrate. The patient was persistently tachycardic in Dr. Santiago BurMaloney's office and is being admitted for further evaluation and management.   The patient is also having some rib cage pain due to the constant coughing. She reports not having a bowel movement for last 3 to 4 days.   PAST MEDICAL HISTORY:  1.  Insomnia.  2.  Bronchitis.  3.  Hyperlipidemia.  4.  History of syncope.     FAMILY HISTORY: Grandfather had emphysema. Brother also had significant lung disease.  Mother had heart disease, coronary artery disease, lymphoma, diabetes, and history of stroke.   SOCIAL HISTORY: She lives at home with her husband. No smoking. Occasional alcohol. She is volunteering at [employer] and has signed [facility] to [facility] program. She used to work as a Manufacturing systems engineerpreschool teacher.   MEDICATIONS AT HOME:  1.  Lunesta 3 mg p.o. at  bedtime.  2.  Simvastatin 10 mg p.o. daily.  3.  Singulair once daily.  4.  Multivitamin once daily.  5.  B Complete once daily.  6.  Magnesium oxide 400 mg p.o. daily.   ALLERGIES: No known drug allergies.   REVIEW OF SYSTEMS:  CONSTITUTIONAL: Positive for low-grade fever, fatigue and weakness.  EYES: No blurred or double vision.  ENT: No tinnitus or ear pain.  RESPIRATORY: Positive for cough with greenish sputum, now converting to dry. Minimal wheezing. No hemoptysis. Positive for dyspnea and ribcage pain from constant coughing.  CARDIOVASCULAR: Pain under the ribcage due to constant coughing. No orthopnea, edema.  GASTROINTESTINAL: No nausea, vomiting, diarrhea, burning.  GENITOURINARY: No dysuria, hematuria.  ENDOCRINE: No polyuria or nocturia.  HEMATOLOGY: No anemia or easy bleeding.  SKIN: No rash or lesion.  MUSCULOSKELETAL: No arthritis or muscle cramp.  NEUROLOGIC: No tingling, numbness, weakness.  PSYCHIATRIC: No history of anxiety or depression.   PHYSICAL EXAMINATION:  VITAL SIGNS: Temperature 99.5, heart rate 92 per minute, respirations 22 per minute, blood pressure 120/89 mmHg. She is saturating 94% on room air.  GENERAL: A 69 year old female lying in the bed in minimal acute respiratory distress.  EYES: Pupils equal, round, and reactive to light and accommodation. No scleral icterus. Extraocular muscles intact.  HEENT: Head atraumatic, normocephalic. Oropharynx and nasopharynx clear. NECK: Supple. No jugular venous distention. No thyroid enlargement or tenderness.  LUNGS: Decreased breath sounds at the bases. Minimal scattered wheezing throughout both lungs. No  rales. Rhonchi at the right lung base present. No crepitation.  CARDIOVASCULAR: S1, S2 normal. No murmurs, rubs, or gallops.  ABDOMEN: Soft, nontender, nondistended. Bowel sounds present. No organomegaly or mass.  EXTREMITIES: No pedal edema, cyanosis, clubbing.  NEUROLOGIC: Nonfocal examination. Cranial nerves  II through XII intact. Muscle strength 5/5 in all extremities. Sensation intact.  PSYCHIATRIC: Alert and oriented x3.  SKIN: No obvious rash, lesion or ulcer.   LABORATORY DATA: Ordered but pending blood test.   Chest x-ray on 6th of October was positive for right middle lobe infiltrate.   CT scan of chest on 9th of October showed no PE. Infiltrate at the right lower lobe. Cannot rule out underlying neoplastic disease.   IMPRESSION AND PLAN:  1.  Systemic inflammatory response syndrome with low-grade fever, tachycardia and possible lung infection, likely right-sided pneumonia. We will start her on IV Levaquin and Zosyn as she has been on Levaquin without significant improvement. Will cover her with broad-spectrum antibiotics at this time. Start on IV steroids, nebulizer breathing treatment along with Advair and Spiriva. We will continue her Singulair. 2.  Pneumonia. Management as above. Will obtain sputum culture and sensitivity. There is some concern for possible chronic obstructive pulmonary disease considering her strong family history in her father and brother, both, even though she is not a smoker, as she did have bronchitis.  3.  Insomnia. She is on [place] which we will continue.     CODE STATUS: FULL CODE.     TOTAL TIME TAKING CARE OF THIS PATIENT: 45 minutes.    ____________________________ Ellamae Sia. Sherryll Burger, MD vss:np D: 08/23/2013 20:08:40 ET T: 08/23/2013 21:03:07 ET JOB#: [id number]  cc: Senetra Dillin S. Sherryll Burger, MD, <Dictator> Leo Grosser, MD Ellamae Sia [facility] MD ELECTRONICALLY SIGNED 08/24/2013 13:26

## 2015-04-08 DIAGNOSIS — J309 Allergic rhinitis, unspecified: Secondary | ICD-10-CM | POA: Insufficient documentation

## 2015-04-08 DIAGNOSIS — E7800 Pure hypercholesterolemia, unspecified: Secondary | ICD-10-CM | POA: Insufficient documentation

## 2015-04-08 DIAGNOSIS — J45909 Unspecified asthma, uncomplicated: Secondary | ICD-10-CM | POA: Insufficient documentation

## 2015-04-08 DIAGNOSIS — E010 Iodine-deficiency related diffuse (endemic) goiter: Secondary | ICD-10-CM | POA: Insufficient documentation

## 2015-04-08 DIAGNOSIS — G4700 Insomnia, unspecified: Secondary | ICD-10-CM | POA: Insufficient documentation

## 2015-04-08 DIAGNOSIS — R011 Cardiac murmur, unspecified: Secondary | ICD-10-CM | POA: Insufficient documentation

## 2015-04-20 ENCOUNTER — Other Ambulatory Visit: Payer: Self-pay | Admitting: Family Medicine

## 2015-04-20 DIAGNOSIS — G4700 Insomnia, unspecified: Secondary | ICD-10-CM

## 2015-04-23 ENCOUNTER — Other Ambulatory Visit: Payer: Self-pay | Admitting: Family Medicine

## 2015-04-23 DIAGNOSIS — G4700 Insomnia, unspecified: Secondary | ICD-10-CM

## 2015-04-23 NOTE — Telephone Encounter (Signed)
Printed. Please bring to Med Records to fax to [facility] [place]. Thanks.

## 2015-06-10 ENCOUNTER — Ambulatory Visit (INDEPENDENT_AMBULATORY_CARE_PROVIDER_SITE_OTHER): Payer: Medicare Other | Admitting: Family Medicine

## 2015-06-10 ENCOUNTER — Encounter: Payer: Self-pay | Admitting: Family Medicine

## 2015-06-10 VITALS — BP 124/80 | HR 73 | Temp 98.1°F | Resp 16 | Ht 60.0 in | Wt 137.4 lb

## 2015-06-10 DIAGNOSIS — L740 Miliaria rubra: Secondary | ICD-10-CM

## 2015-06-10 MED ORDER — FLUOCINONIDE 0.05 % EX CREA: 1.0000 "application " | TOPICAL_CREAM | Freq: Three times a day (TID) | CUTANEOUS | Status: DC

## 2015-06-10 NOTE — Patient Instructions (Signed)
May use Benadryl 25 mg.at night for itching. Continue fexofenadine.

## 2015-06-10 NOTE — Progress Notes (Signed)
Subjective:     Patient ID: Diamond Allen, female   DOB: 04-17-1946, 69 y.o.   MRN: [id number]  HPI  Chief Complaint  Patient presents with  . Rash    Patient comes in office today with concerns of rash on the back of her neck and scalp. Patient states that itching began on Friday and she has tried applying cortisone cream with little relief. Patient denies new medication, detergent, lotion, bodywash/shampoo or new food products  States she does walk in the AM and sweats a lot.  Her hairstyle does cover this area.   Review of Systems  Skin:       Reports generalized itching over her scalp but not rash which has kept her up at night. Remains on fexofenadine.       Objective:   Physical Exam  Constitutional: She appears well-developed and well-nourished. No distress.  Skin:  Scattered 2 mm.papules on the posterior aspect of her neck which end at the hairline.       Assessment:    1. Heat rash - fluocinonide cream (LIDEX) 0.05 %; Apply 1 application topically 3 (three) times daily. To neck rash  Dispense: 15 g; Refill: 0    Plan:    May add Benadryl at bedtime.

## 2015-06-25 ENCOUNTER — Ambulatory Visit (INDEPENDENT_AMBULATORY_CARE_PROVIDER_SITE_OTHER): Payer: Medicare Other | Admitting: Family Medicine

## 2015-06-25 ENCOUNTER — Encounter: Payer: Self-pay | Admitting: Family Medicine

## 2015-06-25 VITALS — BP 120/68 | HR 68 | Temp 98.7°F | Resp 16 | Ht 60.0 in | Wt 136.0 lb

## 2015-06-25 DIAGNOSIS — E049 Nontoxic goiter, unspecified: Secondary | ICD-10-CM | POA: Diagnosis not present

## 2015-06-25 DIAGNOSIS — I10 Essential (primary) hypertension: Secondary | ICD-10-CM | POA: Diagnosis not present

## 2015-06-25 DIAGNOSIS — Z0000 Encounter for general adult medical examination without abnormal findings: Secondary | ICD-10-CM | POA: Diagnosis not present

## 2015-06-25 DIAGNOSIS — R739 Hyperglycemia, unspecified: Secondary | ICD-10-CM | POA: Diagnosis not present

## 2015-06-25 DIAGNOSIS — E7800 Pure hypercholesterolemia, unspecified: Secondary | ICD-10-CM

## 2015-06-25 DIAGNOSIS — E010 Iodine-deficiency related diffuse (endemic) goiter: Secondary | ICD-10-CM

## 2015-06-25 LAB — POCT URINALYSIS DIPSTICK
Bilirubin, UA: NEGATIVE
GLUCOSE UA: NEGATIVE
Ketones, UA: NEGATIVE
Leukocytes, UA: NEGATIVE
NITRITE UA: NEGATIVE
PH UA: 6
PROTEIN UA: NEGATIVE
RBC UA: NEGATIVE
SPEC GRAV UA: 1.015
Urobilinogen, UA: 0.2

## 2015-06-25 NOTE — Progress Notes (Addendum)
Patient ID: Diamond Allen, female   DOB: [birth year], 69 y.o.   MRN: [id number]       Patient: Diamond Allen, Female    DOB: 1946/06/21, 69 y.o.   MRN: [id number] Visit Date: 06/25/2015  Today's Provider: Lorie Phenix, MD   Chief Complaint  Patient presents with  . Medicare Wellness   Subjective:    Annual wellness visit Diamond Allen is a 69 y.o. female. She feels well. She reports exercising 6 days a week. She reports she is sleeping fairly well. Patient would like lab work done today, pt reports that she has been off her simvastatin for about 2 months.  Having a lot of stress around her kids and grand kids. Not much she can fix. Does not think she needs a counselor right now.   12/23/11 CPE 12/23/11 Pap-neg 02/18/15 Mammogram-BI-RADS 1 04/16/14 Colonoscopy-polyp,diverticulosis 08/22/13 EKG  Results for orders placed or performed in visit on 06/25/15  POCT urinalysis dipstick  Result Value Ref Range   Color, UA straw    Clarity, UA clear    Glucose, UA neg    Bilirubin, UA neg    Ketones, UA neg    Spec Grav, UA 1.015    Blood, UA neg    pH, UA 6.0    Protein, UA neg    Urobilinogen, UA 0.2    Nitrite, UA neg    Leukocytes, UA Negative Negative    Lab Results  Component Value Date   WBC 5.0 06/19/2014   HGB 14.1 06/19/2014   HCT 42 06/19/2014   PLT 272 06/19/2014   GLUCOSE 158* 09/16/2013   ALT 23 06/19/2014   AST 18 06/19/2014   NA 140 06/19/2014   K 4.3 06/19/2014   CL 107 09/16/2013   CREATININE 0.7 06/19/2014   BUN 25* 06/19/2014   CO2 21 09/16/2013   TSH 1.29 06/19/2014    -----------------------------------------------------------   Review of Systems  Constitutional: Positive for activity change.  HENT: Positive for voice change.   Eyes: Negative.   Respiratory: Positive for cough and choking.   Cardiovascular: Negative.   Gastrointestinal: Positive for rectal pain.  Endocrine: Positive for heat intolerance.    Genitourinary: Negative.   Musculoskeletal: Positive for neck pain.  Skin: Negative.   Allergic/Immunologic: Negative.   Neurological: Positive for dizziness and headaches.  Hematological: Negative.   Psychiatric/Behavioral: Negative.     Social History   Social History  . Marital Status: Married    Spouse Name: Chrissie Noa  . Number of Children: 4  . Years of Education: N/A   Occupational History  . Retired-Volunteers at [employer]  Other   Social History Main Topics  . Smoking status: Never Smoker   . Smokeless tobacco: Never Used  . Alcohol Use: 1.2 oz/week    2 Glasses of wine per week     Comment: couple times per week  . Drug Use: No  . Sexual Activity: Not on file   Other Topics Concern  . Not on file   Social History Narrative    Patient Active Problem List   Diagnosis Date Noted  . Allergic rhinitis 04/08/2015  . Big thyroid 04/08/2015  . Cardiac murmur 04/08/2015  . Hypercholesteremia 04/08/2015  . BP (high blood pressure) 04/08/2015  . Cannot sleep 04/08/2015  . RAD (reactive airway disease) 04/08/2015  . Benign essential HTN 02/26/2015  . Palpitations 09/18/2013  . Pneumonia 09/04/2013  . Cough 09/04/2013  . Shortness of breath 01/16/2013  . Chest  tightness 01/16/2013    Past Surgical History  Procedure Laterality Date  . Tonsillectomy  1953  . Appendectomy  1956  . Vesicovaginal fistula closure w/ tah  1990  . Breast surgery  1985    biopsy  . Abdominal hysterectomy  1990    Her family history includes Alzheimer's disease in her maternal grandmother; CVA in her mother; Diabetes in her brother, maternal grandfather, and mother; Diabetes type II in her maternal grandfather and mother; Emphysema in her maternal grandfather; Heart disease in her maternal grandmother; Lymphoma in her mother; Scleroderma in her sister.    Previous Medications   B COMPLEX VITAMINS TABLET    Take 1 tablet by mouth daily.   ESZOPICLONE 3 MG TABS    TAKE 1 TABLET BY  MOUTH AT BEDTIME   FEXOFENADINE (ALLEGRA) 180 MG TABLET    Take 180 mg by mouth daily.    FLUTICASONE (FLONASE) 50 MCG/ACT NASAL SPRAY    Place 2 sprays into the nose.    LORATADINE (CLARITIN) 10 MG TABLET    Take 10 mg by mouth daily as needed for allergies.   MAGNESIUM 400 MG CAPS    Take 1 tablet by mouth daily.   METOPROLOL SUCCINATE (TOPROL-XL) 25 MG 24 HR TABLET    Take 25 mg by mouth daily.    MONTELUKAST (SINGULAIR) 10 MG TABLET    Take 10 mg by mouth daily as needed.   MULTIPLE VITAMINS-MINERALS (MULTIVITAMIN & MINERAL PO)    Take 1 tablet by mouth daily.   SIMVASTATIN (ZOCOR) 10 MG TABLET    Take 10 mg by mouth at bedtime.   SPACER/AERO-HOLDING CHAMBERS (E-Z SPACER) INHALER    Use as instructed    Patient Care Team: Lorie Phenix, MD as PCP - General (Family Medicine) Lupita Leash, MD as Attending Physician (Internal Medicine)     Objective:   Vitals: BP 120/68 mmHg  Pulse 68  Temp(Src) 98.7 F (37.1 C) (Oral)  Resp 16  Ht 5' (1.524 m)  Wt 136 lb (61.689 kg)  BMI 26.56 kg/m2  SpO2 98%  Physical Exam  Constitutional: She is oriented to person, place, and time. She appears well-developed and well-nourished.  HENT:  Head: Normocephalic and atraumatic.  Right Ear: Tympanic membrane, external ear and ear Allen normal.  Left Ear: Tympanic membrane, external ear and ear Allen normal.  Nose: Nose normal.  Mouth/Throat: Uvula is midline, oropharynx is clear and moist and mucous membranes are normal.  Eyes: Conjunctivae, EOM and lids are normal. Pupils are equal, round, and reactive to light.  Neck: Trachea normal and normal range of motion. Neck supple. Carotid bruit is not present. Thyromegaly present. No thyroid mass present.  Cardiovascular: Normal rate and regular rhythm.   Murmur (systolic ejection murmur) heard. Pulmonary/Chest: Effort normal and breath sounds normal.  Abdominal: Soft. Normal appearance and bowel sounds are normal. There is no hepatosplenomegaly.  There is no tenderness.  Musculoskeletal: Normal range of motion.  Lymphadenopathy:    She has no cervical adenopathy.    She has no axillary adenopathy.  Neurological: She is alert and oriented to person, place, and time. She has normal strength. No cranial nerve deficit.  Skin: Skin is warm, dry and intact.  Psychiatric: She has a normal mood and affect. Her speech is normal and behavior is normal. Judgment and thought content normal. Cognition and memory are normal.    Activities of Daily Living In your present state of health, do you have any difficulty performing  the following activities: 06/25/2015  Hearing? N  Vision? N  Difficulty concentrating or making decisions? Y  Walking or climbing stairs? N  Dressing or bathing? N  Doing errands, shopping? N    Fall Risk Assessment Fall Risk  06/25/2015  Falls in the past year? No     Depression Screen PHQ 2/9 Scores 06/25/2015  PHQ - 2 Score 2  PHQ- 9 Score 7    Cognitive Testing - 6-CIT  Correct? Score   What year is it? yes 0 0 or 4  What month is it? yes 0 0 or 3  Memorize:    Floyde Parkins,  44,  [address]  [place],      What time is it? (within 1 hour) yes 0 0 or 3  Count backwards from 20 yes 0 0, 2, or 4  Name the months of the year yes 0 0, 2, or 4  Repeat name & address above yes 1 0, 2, 4, 6, 8, or 10       TOTAL SCORE  1/28   Interpretation:  Normal  Normal (0-7) Abnormal (8-28)       Assessment & Plan:     Annual Wellness Visit  Reviewed patient's Family Medical History Reviewed and updated list of patient's medical providers Assessment of cognitive impairment was done Assessed patient's functional ability Established a written schedule for health screening services Health Risk Assessent Completed and Reviewed  Exercise Activities and Dietary recommendations Goals    . Exercise 150 minutes per week (moderate activity)       Immunization History  Administered Date(s) Administered  . Influenza  Split 08/29/2013  . Influenza Whole 08/15/2012  . Pneumococcal Conjugate-13 06/19/2014  . Pneumococcal Polysaccharide-23 08/15/2012    Health Maintenance  Topic Date Due  . Hepatitis C Screening  Nov 02, 1946  . TETANUS/TDAP  05/14/1967  . ZOSTAVAX  05/13/2008  . DEXA SCAN  05/13/2013  . INFLUENZA VACCINE  06/16/2015  . MAMMOGRAM  02/17/2017  . PNA vac Low Risk Adult (2 of 2 - PPSV23) 08/15/2017  . COLONOSCOPY  04/16/2024    - POCT urinalysis dipstick    2. Benign essential HTN Stable. Continue current medications. Check labs.  - CBC with Differential/Platelet - Comprehensive metabolic panel  3. Hypercholesteremia Check labs off medication.  - Lipid Panel With LDL/HDL Ratio  4. Hyperglycemia Check labs.  - Hemoglobin A1c  5. Big thyroid Check labs.  - TSH  Patient was seen and examined by Leo Grosser, MD, and note scribed by Rondel Baton, CMA.   I have reviewed the document for accuracy and completeness and I agree with above. Leo Grosser, MD   Lorie Phenix, MD

## 2015-06-26 ENCOUNTER — Telehealth: Payer: Self-pay

## 2015-06-26 ENCOUNTER — Telehealth: Payer: Self-pay | Admitting: Family Medicine

## 2015-06-26 LAB — CBC WITH DIFFERENTIAL/PLATELET
Basophils Absolute: 0 10*3/uL (ref 0.0–0.2)
Basos: 1 %
EOS (ABSOLUTE): 0.2 10*3/uL (ref 0.0–0.4)
EOS: 5 %
HEMATOCRIT: 41.9 % (ref 34.0–46.6)
Hemoglobin: 13.9 g/dL (ref 11.1–15.9)
Immature Grans (Abs): 0 10*3/uL (ref 0.0–0.1)
Immature Granulocytes: 0 %
LYMPHS ABS: 1.4 10*3/uL (ref 0.7–3.1)
LYMPHS: 35 %
MCH: 29.7 pg (ref 26.6–33.0)
MCHC: 33.2 g/dL (ref 31.5–35.7)
MCV: 90 fL (ref 79–97)
MONOCYTES: 7 %
Monocytes Absolute: 0.3 10*3/uL (ref 0.1–0.9)
NEUTROS ABS: 2.2 10*3/uL (ref 1.4–7.0)
Neutrophils: 52 %
Platelets: 262 10*3/uL (ref 150–379)
RBC: 4.68 x10E6/uL (ref 3.77–5.28)
RDW: 14.8 % (ref 12.3–15.4)
WBC: 4.2 10*3/uL (ref 3.4–10.8)

## 2015-06-26 LAB — COMPREHENSIVE METABOLIC PANEL
ALT: 16 IU/L (ref 0–32)
AST: 22 IU/L (ref 0–40)
Albumin/Globulin Ratio: 1.8 (ref 1.1–2.5)
Albumin: 4.7 g/dL (ref 3.6–4.8)
Alkaline Phosphatase: 86 IU/L (ref 39–117)
BUN/Creatinine Ratio: 30 — ABNORMAL HIGH (ref 11–26)
BUN: 24 mg/dL (ref 8–27)
Bilirubin Total: 0.5 mg/dL (ref 0.0–1.2)
CALCIUM: 10 mg/dL (ref 8.7–10.3)
CO2: 24 mmol/L (ref 18–29)
CREATININE: 0.81 mg/dL (ref 0.57–1.00)
Chloride: 99 mmol/L (ref 97–108)
GFR, EST AFRICAN AMERICAN: 87 mL/min/{1.73_m2} (ref >59)
GFR, EST NON AFRICAN AMERICAN: 75 mL/min/{1.73_m2} (ref >59)
GLUCOSE: 91 mg/dL (ref 65–99)
Globulin, Total: 2.6 g/dL (ref 1.5–4.5)
Potassium: 4.6 mmol/L (ref 3.5–5.2)
Sodium: 141 mmol/L (ref 134–144)
TOTAL PROTEIN: 7.3 g/dL (ref 6.0–8.5)

## 2015-06-26 LAB — LIPID PANEL WITH LDL/HDL RATIO
Cholesterol, Total: 288 mg/dL — ABNORMAL HIGH (ref 100–199)
HDL: 43 mg/dL (ref >39)
LDL CALC: 203 mg/dL — AB (ref 0–99)
LDl/HDL Ratio: 4.7 ratio units — ABNORMAL HIGH (ref 0.0–3.2)
TRIGLYCERIDES: 211 mg/dL — AB (ref 0–149)
VLDL CHOLESTEROL CAL: 42 mg/dL — AB (ref 5–40)

## 2015-06-26 LAB — HEMOGLOBIN A1C
ESTIMATED AVERAGE GLUCOSE: 123 mg/dL
HEMOGLOBIN A1C: 5.9 % — AB (ref 4.8–5.6)

## 2015-06-26 LAB — TSH: TSH: 1.29 u[IU]/mL (ref 0.450–4.500)

## 2015-06-26 NOTE — Telephone Encounter (Signed)
-----   Message from Lorie Phenix, MD sent at 06/26/2015  1:47 PM EDT ----- Labs stable except for cholesterol. Blood sugar just slightly above normal. Cholesterol is 200, with bad cholesterol over 200. Please see if patient would be willing to try medication again. Thanks.

## 2015-06-26 NOTE — Telephone Encounter (Signed)
Accidentally opened. See result note.

## 2015-06-26 NOTE — Telephone Encounter (Signed)
Pt advised as directed below.  She says she does not want to restart her cholesterol medication right now.  She is working on diet and exercise, and would like to give it a little more time.   Thanks,   -Vernona Rieger

## 2015-09-15 ENCOUNTER — Other Ambulatory Visit: Payer: Self-pay | Admitting: Family Medicine

## 2015-09-15 DIAGNOSIS — I10 Essential (primary) hypertension: Secondary | ICD-10-CM

## 2015-09-15 DIAGNOSIS — G4700 Insomnia, unspecified: Secondary | ICD-10-CM

## 2015-09-15 DIAGNOSIS — E7800 Pure hypercholesterolemia, unspecified: Secondary | ICD-10-CM

## 2015-09-15 MED ORDER — METOPROLOL SUCCINATE ER 25 MG PO TB24: 25.0000 mg | ORAL_TABLET | Freq: Every day | ORAL | Status: DC

## 2015-09-15 MED ORDER — SIMVASTATIN 10 MG PO TABS: 10.0000 mg | ORAL_TABLET | Freq: Every day | ORAL | Status: DC

## 2015-09-15 MED ORDER — ESZOPICLONE 3 MG PO TABS: 3.0000 mg | ORAL_TABLET | Freq: Every day | ORAL | Status: DC

## 2015-09-15 NOTE — Telephone Encounter (Signed)
Printed, please fax or call in to pharmacy. Thank you.   

## 2015-09-15 NOTE — Telephone Encounter (Signed)
Refill Eszopiclone 3 MG TABS Taking 04/23/15 -- Lorie PhenixNancy Maloney, MD TAKE 1 TABLET BY MOUTH AT BEDTIME simvastatin (ZOCOR) 10 MG tablet metoprolol succinate (TOPROL-XL) 25 MG 24 hr tablet  [facility]  Call back  [phone]  Patient also wants to know when do you want her to have her sugar redone.  Thanks Barth Kirkseri

## 2015-09-15 NOTE — Telephone Encounter (Signed)
Pt's last A1C was 06/2015 (5.9%); do you want her to recheck in 3 or 6 months?  Thanks,   -Vernona RiegerLaura

## 2015-09-18 ENCOUNTER — Other Ambulatory Visit: Payer: Self-pay | Admitting: Family Medicine

## 2015-09-18 DIAGNOSIS — I10 Essential (primary) hypertension: Secondary | ICD-10-CM

## 2015-09-22 ENCOUNTER — Other Ambulatory Visit: Payer: Self-pay | Admitting: Family Medicine

## 2015-09-22 DIAGNOSIS — J309 Allergic rhinitis, unspecified: Secondary | ICD-10-CM

## 2015-09-22 MED ORDER — MONTELUKAST SODIUM 10 MG PO TABS: 10.0000 mg | ORAL_TABLET | Freq: Every day | ORAL | Status: DC | PRN

## 2015-09-22 NOTE — Telephone Encounter (Signed)
Pt needs refill on montelukast (SINGULAIR) 10 MG tablet  [facility]   Cal back is   [phone]  Thanks Barth Kirkseri

## 2015-12-12 ENCOUNTER — Encounter: Payer: Self-pay | Admitting: *Deleted

## 2015-12-12 ENCOUNTER — Emergency Department
Admission: EM | Admit: 2015-12-12 | Discharge: 2015-12-12 | Disposition: A | Discharge status: Home / Self Care | Payer: Medicare Other | Attending: Emergency Medicine | Admitting: Emergency Medicine

## 2015-12-12 DIAGNOSIS — Z5321 Procedure and treatment not carried out due to patient leaving prior to being seen by health care provider: Secondary | ICD-10-CM | POA: Insufficient documentation

## 2015-12-12 DIAGNOSIS — R55 Syncope and collapse: Secondary | ICD-10-CM | POA: Diagnosis present

## 2015-12-12 DIAGNOSIS — Z9189 Other specified personal risk factors, not elsewhere classified: Secondary | ICD-10-CM

## 2015-12-12 LAB — URINALYSIS COMPLETE WITH MICROSCOPIC (ARMC ONLY)
BACTERIA UA: NONE SEEN
Bilirubin Urine: NEGATIVE
Glucose, UA: NEGATIVE mg/dL
KETONES UR: NEGATIVE mg/dL
Leukocytes, UA: NEGATIVE
Nitrite: NEGATIVE
PH: 7 (ref 5.0–8.0)
PROTEIN: NEGATIVE mg/dL
Specific Gravity, Urine: 1.013 (ref 1.005–1.030)

## 2015-12-12 LAB — CBC
HEMATOCRIT: 40.9 % (ref 35.0–47.0)
HEMOGLOBIN: 14 g/dL (ref 12.0–16.0)
MCH: 31.3 pg (ref 26.0–34.0)
MCHC: 34.3 g/dL (ref 32.0–36.0)
MCV: 91.2 fL (ref 80.0–100.0)
Platelets: 259 10*3/uL (ref 150–440)
RBC: 4.48 MIL/uL (ref 3.80–5.20)
RDW: 13.4 % (ref 11.5–14.5)
WBC: 5.8 10*3/uL (ref 3.6–11.0)

## 2015-12-12 LAB — BASIC METABOLIC PANEL
ANION GAP: 7 (ref 5–15)
BUN: 21 mg/dL — ABNORMAL HIGH (ref 6–20)
CALCIUM: 9.7 mg/dL (ref 8.9–10.3)
CHLORIDE: 106 mmol/L (ref 101–111)
CO2: 27 mmol/L (ref 22–32)
Creatinine, Ser: 0.62 mg/dL (ref 0.44–1.00)
GFR calc non Af Amer: >60 mL/min (ref >60)
GLUCOSE: 103 mg/dL — AB (ref 65–99)
Potassium: 4.7 mmol/L (ref 3.5–5.1)
Sodium: 140 mmol/L (ref 135–145)

## 2015-12-12 NOTE — ED Notes (Signed)
Pt has a history of vertigo, pt reports having a syncopal episode today in the kitchen, pt denies hitting head or LOC

## 2015-12-13 NOTE — ED Provider Notes (Signed)
-----------------------------------------   12:14 AM on 12/13/2015 -----------------------------------------  I did not see this patient, she apparently left without being seen after triage.  Jeanmarie Plant, MD 12/13/15 [phone]

## 2015-12-15 ENCOUNTER — Telehealth: Payer: Self-pay | Admitting: Family Medicine

## 2015-12-15 NOTE — Telephone Encounter (Signed)
Pt reports she has not experienced anymore syncopal episodes. Is still experiencing general malaise. Pt states she will make OV later this week if she feels no better. Allene Dillon, CMA

## 2015-12-15 NOTE — Telephone Encounter (Signed)
Labs normal.  Please notify patient. Needs ov if still having symptoms.  Thanks.

## 2015-12-15 NOTE — Telephone Encounter (Signed)
Pt called in Friday pm , spoke to triage nurse .  Pt had fainted and was told to go to the ER.  Pt went to [facility] had triage test done and then waited 4 hours and decided to leave. She wants to know if you can call her back with the test results form the ER visit.  She has had no more lightheadedness but still feel bad.  Her call back is  [phone]  Thanks Barth Kirks

## 2015-12-16 ENCOUNTER — Telehealth: Payer: Self-pay | Admitting: Emergency Medicine

## 2015-12-29 ENCOUNTER — Ambulatory Visit
Admission: RE | Admit: 2015-12-29 | Discharge: 2015-12-29 | Disposition: A | Discharge status: Home / Self Care | Payer: Medicare Other | Source: Ambulatory Visit | Attending: Family Medicine | Admitting: Family Medicine

## 2015-12-29 ENCOUNTER — Encounter: Payer: Self-pay | Admitting: Family Medicine

## 2015-12-29 ENCOUNTER — Telehealth: Payer: Self-pay

## 2015-12-29 ENCOUNTER — Ambulatory Visit (INDEPENDENT_AMBULATORY_CARE_PROVIDER_SITE_OTHER): Payer: Medicare Other | Admitting: Family Medicine

## 2015-12-29 VITALS — BP 122/88 | HR 72 | Temp 98.6°F | Resp 16

## 2015-12-29 DIAGNOSIS — R05 Cough: Secondary | ICD-10-CM | POA: Diagnosis not present

## 2015-12-29 DIAGNOSIS — J309 Allergic rhinitis, unspecified: Secondary | ICD-10-CM

## 2015-12-29 DIAGNOSIS — J209 Acute bronchitis, unspecified: Secondary | ICD-10-CM | POA: Diagnosis not present

## 2015-12-29 MED ORDER — FLUTICASONE PROPIONATE 50 MCG/ACT NA SUSP: 2.0000 | Freq: Every day | NASAL | Status: AC

## 2015-12-29 MED ORDER — HYDROCODONE-HOMATROPINE 5-1.5 MG/5ML PO SYRP: 5.0000 mL | ORAL_SOLUTION | Freq: Three times a day (TID) | ORAL | Status: DC | PRN

## 2015-12-29 MED ORDER — AMOXICILLIN-POT CLAVULANATE 875-125 MG PO TABS: 1.0000 | ORAL_TABLET | Freq: Two times a day (BID) | ORAL | Status: DC

## 2015-12-29 NOTE — Telephone Encounter (Signed)
-----   Message from Lorie Phenix, MD sent at 12/29/2015  1:35 PM EST ----- No pneumonia. Please proceed with antibiotic. Call if worsens or does not improve. Thanks.

## 2015-12-29 NOTE — Progress Notes (Signed)
Patient ID: Diamond Allen, female   DOB: March 10, 1946, 70 y.o.   MRN: [id number]         Patient: Diamond Allen Female    DOB: 09-25-1946   70 y.o.   MRN: [id number] Visit Date: 12/29/2015  Today's Provider: Lorie Phenix, MD   Chief Complaint  Patient presents with  . URI   Subjective:    URI  This is a new problem. The current episode started in the past 7 days. The problem has been unchanged. There has been no fever. Associated symptoms include congestion, coughing and wheezing. Pertinent negatives include no ear pain, headaches, rhinorrhea, sneezing or sore throat. She has tried decongestant for the symptoms. The treatment provided mild relief.   Does have history of pneumonia.      Allergies  Allergen Reactions  . Albuterol Palpitations   Previous Medications   B COMPLEX VITAMINS TABLET    Take 1 tablet by mouth daily. Reported on 12/29/2015   FLUTICASONE (FLONASE) 50 MCG/ACT NASAL SPRAY    Place 2 sprays into the nose.    LORATADINE (CLARITIN) 10 MG TABLET    Take 10 mg by mouth daily as needed for allergies.   MAGNESIUM 400 MG CAPS    Take 1 tablet by mouth daily.   METOPROLOL SUCCINATE (TOPROL-XL) 25 MG 24 HR TABLET    TAKE 1 TABLET BY MOUTH DAILY   MONTELUKAST (SINGULAIR) 10 MG TABLET    Take 1 tablet (10 mg total) by mouth daily as needed.   MULTIPLE VITAMINS-MINERALS (MULTIVITAMIN & MINERAL PO)    Take 1 tablet by mouth daily.   SIMVASTATIN (ZOCOR) 10 MG TABLET    Take 1 tablet (10 mg total) by mouth at bedtime.   SPACER/AERO-HOLDING CHAMBERS (E-Z SPACER) INHALER    Use as instructed    Review of Systems  Constitutional: Positive for chills and fatigue. Negative for fever, diaphoresis, activity change, appetite change and unexpected weight change.  HENT: Positive for congestion. Negative for ear discharge, ear pain, facial swelling, nosebleeds, postnasal drip, rhinorrhea, sinus pressure, sneezing, sore throat, tinnitus, trouble swallowing and voice change.     Eyes: Negative for photophobia, pain, discharge, redness, itching and visual disturbance.  Respiratory: Positive for cough, chest tightness, shortness of breath and wheezing. Negative for apnea, choking and stridor.   Cardiovascular: Negative.   Gastrointestinal: Negative.   Neurological: Positive for light-headedness. Negative for dizziness and headaches.    Social History  Substance Use Topics  . Smoking status: Never Smoker   . Smokeless tobacco: Never Used  . Alcohol Use: 1.2 oz/week    2 Glasses of wine per week     Comment: couple times per week   Objective:   BP 122/88 mmHg  Pulse 72  Temp(Src) 98.6 F (37 C) (Oral)  Resp 16  Physical Exam  Constitutional: She is oriented to person, place, and time. She appears well-developed and well-nourished.  HENT:  Head: Normocephalic and atraumatic.  Right Ear: External ear normal.  Left Ear: External ear normal.  Nose: Right sinus exhibits no maxillary sinus tenderness. Left sinus exhibits no maxillary sinus tenderness.  Mouth/Throat: Oropharynx is clear and moist.  Pulmonary/Chest: Effort normal. No tachypnea. She has rales (and rhonchi diffusely).  Neurological: She is alert and oriented to person, place, and time. She has normal reflexes.  Psychiatric: She has a normal mood and affect. Her behavior is normal. Judgment and thought content normal.      Assessment & Plan:  1. Allergic rhinitis, unspecified allergic rhinitis type Continue current medication.   - fluticasone (FLONASE) 50 MCG/ACT nasal spray; Place 2 sprays into both nostrils daily.  Dispense: 16 g; Refill: 5  2. Acute bronchitis, unspecified organism New problem. Condition is worsening. Will start medication for better control.  Patient instructed to call back if condition worsens or does not improve.    - amoxicillin-clavulanate (AUGMENTIN) 875-125 MG tablet; Take 1 tablet by mouth 2 (two) times daily.  Dispense: 20 tablet; Refill: 0 - DG Chest 2 View -  HYDROcodone-homatropine (HYCODAN) 5-1.5 MG/5ML syrup; Take 5 mLs by mouth every 8 (eight) hours as needed for cough.  Dispense: 120 mL; Refill: 0     Patient was seen and examined by Leo Grosser, MD, and note scribed by Kavin Leech, CMA.  I have reviewed the document for accuracy and completeness and I agree with above. - Leo Grosser, MD   Lorie Phenix, MD  [facility]

## 2015-12-29 NOTE — Telephone Encounter (Signed)
Pt advised.   Thanks,   -Dadrian Ballantine  

## 2016-03-11 ENCOUNTER — Other Ambulatory Visit: Payer: Self-pay | Admitting: Family Medicine

## 2016-03-11 DIAGNOSIS — I10 Essential (primary) hypertension: Secondary | ICD-10-CM

## 2016-03-15 ENCOUNTER — Other Ambulatory Visit: Payer: Self-pay | Admitting: Family Medicine

## 2016-03-15 DIAGNOSIS — J309 Allergic rhinitis, unspecified: Secondary | ICD-10-CM

## 2016-03-15 DIAGNOSIS — E7800 Pure hypercholesterolemia, unspecified: Secondary | ICD-10-CM

## 2016-04-19 ENCOUNTER — Telehealth: Payer: Self-pay | Admitting: Family Medicine

## 2016-04-19 NOTE — Telephone Encounter (Signed)
Informed pt of immunization history. Allene DillonEmily Drozdowski, CMA

## 2016-04-19 NOTE — Telephone Encounter (Signed)
Pt would like to know when she had her last pneumonia shot. Please advise. Thanks TNP

## 2016-07-13 ENCOUNTER — Other Ambulatory Visit: Payer: Self-pay | Admitting: Family Medicine

## 2016-07-13 DIAGNOSIS — J309 Allergic rhinitis, unspecified: Secondary | ICD-10-CM

## 2016-07-13 DIAGNOSIS — E7800 Pure hypercholesterolemia, unspecified: Secondary | ICD-10-CM

## 2016-07-13 DIAGNOSIS — I10 Essential (primary) hypertension: Secondary | ICD-10-CM

## 2016-07-14 NOTE — Telephone Encounter (Signed)
Jenni's pt.   Thanks,   -Laura  

## 2016-11-14 ENCOUNTER — Other Ambulatory Visit: Payer: Self-pay | Admitting: Family Medicine

## 2016-11-14 DIAGNOSIS — I10 Essential (primary) hypertension: Secondary | ICD-10-CM

## 2016-11-28 ENCOUNTER — Other Ambulatory Visit: Payer: Self-pay | Admitting: Family Medicine

## 2016-11-28 DIAGNOSIS — J309 Allergic rhinitis, unspecified: Secondary | ICD-10-CM

## 2016-12-17 ENCOUNTER — Other Ambulatory Visit: Payer: Self-pay | Admitting: Family Medicine

## 2016-12-17 DIAGNOSIS — I10 Essential (primary) hypertension: Secondary | ICD-10-CM

## 2016-12-27 ENCOUNTER — Other Ambulatory Visit: Payer: Self-pay | Admitting: Family Medicine

## 2016-12-27 DIAGNOSIS — I10 Essential (primary) hypertension: Secondary | ICD-10-CM

## 2016-12-28 ENCOUNTER — Other Ambulatory Visit: Payer: Self-pay | Admitting: *Deleted

## 2016-12-28 DIAGNOSIS — E7800 Pure hypercholesterolemia, unspecified: Secondary | ICD-10-CM

## 2016-12-28 MED ORDER — SIMVASTATIN 10 MG PO TABS: ORAL_TABLET | ORAL | 1 refills | Status: DC

## 2016-12-28 MED ORDER — MONTELUKAST SODIUM 10 MG PO TABS: ORAL_TABLET | ORAL | 1 refills | Status: AC

## 2017-01-27 ENCOUNTER — Other Ambulatory Visit: Payer: Self-pay | Admitting: Family Medicine

## 2017-01-27 DIAGNOSIS — I10 Essential (primary) hypertension: Secondary | ICD-10-CM

## 2017-02-24 ENCOUNTER — Other Ambulatory Visit: Payer: Self-pay | Admitting: Family Medicine

## 2017-02-24 DIAGNOSIS — I10 Essential (primary) hypertension: Secondary | ICD-10-CM

## 2017-03-11 ENCOUNTER — Other Ambulatory Visit: Payer: Self-pay | Admitting: Family Medicine

## 2017-03-11 DIAGNOSIS — I10 Essential (primary) hypertension: Secondary | ICD-10-CM

## 2017-03-17 ENCOUNTER — Telehealth: Payer: Self-pay | Admitting: Physician Assistant

## 2017-03-17 NOTE — Telephone Encounter (Signed)
FYI

## 2017-03-17 NOTE — Telephone Encounter (Signed)
Spoke to pt to sched AWV she has moved to [place] and is no longer a pt. She mentioned that these three Rx below are being auto re-filled through her pharmacy and has let them know that she will no longer need them. She wanted to make sure that we were aware as well not to prescribe any more refills:  metoprolol succinate (TOPROL-XL) 25 MG 24 hr tablet [[id number]][[id number]]   simvastatin (ZOCOR) 10 MG tablet [[id number]][[id number]] loratadine (CLARITIN) 10 MG tablet [[id number]][[id number]]   knb

## 2017-04-19 ENCOUNTER — Other Ambulatory Visit: Payer: Self-pay | Admitting: Family Medicine

## 2017-04-19 DIAGNOSIS — E7800 Pure hypercholesterolemia, unspecified: Secondary | ICD-10-CM

## 2017-05-07 ENCOUNTER — Other Ambulatory Visit: Payer: Self-pay | Admitting: Family Medicine

## 2017-05-07 DIAGNOSIS — I10 Essential (primary) hypertension: Secondary | ICD-10-CM

## 2017-05-09 ENCOUNTER — Other Ambulatory Visit: Payer: Self-pay | Admitting: Family Medicine

## 2017-05-09 DIAGNOSIS — E7800 Pure hypercholesterolemia, unspecified: Secondary | ICD-10-CM

## 2017-05-10 NOTE — Telephone Encounter (Signed)
Noted  

## 2017-05-10 NOTE — Telephone Encounter (Signed)
Pt stated she has moved to [place] and is no longer [facility] pt [facility]. Pt request that no more Rx refills be sent by anyone at our office. Please advise. Thanks TNP

## 2017-05-23 ENCOUNTER — Telehealth: Payer: Self-pay | Admitting: Physician Assistant

## 2017-05-23 NOTE — Telephone Encounter (Signed)
Pt called to say she has lived in [place] for over a year now and Dr. Sherrie MustacheFisher is still filling her RX's.   She has established care with a primary there and does not need Dr. Sherrie MustacheFisher to fill any more prescriptions.  Thanks Fortune Brandsteri

## 2017-05-23 NOTE — Telephone Encounter (Signed)
Please review. Thanks!  

## 2017-06-13 ENCOUNTER — Other Ambulatory Visit: Payer: Self-pay | Admitting: Physician Assistant

## 2017-06-13 DIAGNOSIS — E7800 Pure hypercholesterolemia, unspecified: Secondary | ICD-10-CM

## 2017-06-13 MED ORDER — SIMVASTATIN 10 MG PO TABS: 10.0000 mg | ORAL_TABLET | Freq: Every day | ORAL | 0 refills | Status: AC

## 2017-06-13 NOTE — Telephone Encounter (Signed)
[facility] for the following medication. Thanks CC  simvastatin (ZOCOR) 10 MG tablet  >Take 1 tablet by mouth everyday at bedtime.

## 2017-08-19 ENCOUNTER — Other Ambulatory Visit: Payer: Self-pay | Admitting: Family Medicine

## 2017-08-19 NOTE — Telephone Encounter (Signed)
[facility] refill request for the following medications:  montelukast (SINGULAIR) 10 MG tablet  Last Rx: 12/28/16 with 1 refill LOV: 12/29/15 Please advise. Thanks TNP
